# Patient Record
Sex: Female | Born: 1995 | Race: Black or African American | Hispanic: No | Marital: Single | Smoking: Never smoker
Health system: Southern US, Community
[De-identification: ages and names within clinical notes are randomized; demographics above are authoritative.]

## PROBLEM LIST (undated history)

## (undated) HISTORY — PX: LIVER BIOPSY: SHX301

## (undated) HISTORY — PX: BONE MARROW ASPIRATION: SHX1252

---
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
## 2020-10-25 ENCOUNTER — Other Ambulatory Visit: Payer: Self-pay

## 2020-10-25 ENCOUNTER — Emergency Department
Admission: EM | Admit: 2020-10-25 | Discharge: 2020-10-25 | Disposition: A | Discharge status: Home / Self Care | Payer: Commercial Managed Care - PPO | Attending: Emergency Medicine | Admitting: Emergency Medicine

## 2020-10-25 DIAGNOSIS — M79604 Pain in right leg: Secondary | ICD-10-CM | POA: Diagnosis present

## 2020-10-25 DIAGNOSIS — M5431 Sciatica, right side: Secondary | ICD-10-CM | POA: Diagnosis not present

## 2020-10-25 MED ORDER — IBUPROFEN 800 MG PO TABS: 800.0000 mg | ORAL_TABLET | Freq: Three times a day (TID) | ORAL | 0 refills | Status: DC | PRN

## 2020-10-25 MED ORDER — CYCLOBENZAPRINE HCL 5 MG PO TABS: 5.0000 mg | ORAL_TABLET | Freq: Three times a day (TID) | ORAL | 0 refills | Status: DC | PRN

## 2020-10-25 NOTE — ED Notes (Signed)
See triage note  Presents with pain to lower back  Radiates into right leg  Ambulates with slight limp

## 2020-10-25 NOTE — ED Provider Notes (Signed)
[facility] Emergency Department Provider Note ____________________________________________  Time seen: 0935  I have reviewed the triage vital signs and the nursing notes.  HISTORY  Chief Complaint  Leg Pain   HPI Amanda Brown is a 25 y.o. female presents to the for evaluation of 1 day complaint of posterior right-sided leg pain.  Patient describes muscle pain to the posterior right buttocks and thigh, that does not refer beyond the knee.  He denies any recent injury, trauma, or fall.  She also denies any bladder or bowel incontinence, foot drop, or saddle anesthesias.  She reports persistent pain, with sharp flares that makes her feel that her leg is going to give out.  She gives a remote history of similar symptoms, but denies any previous intervention or evaluation.  She has taken sporadic doses of ibuprofen 200 mg, but denies any significant benefit.  History reviewed. No pertinent past medical history.  There are no problems to display for this patient.   History reviewed. No pertinent surgical history.  Prior to Admission medications   Medication Sig Start Date End Date Taking? Authorizing Provider  cyclobenzaprine (FLEXERIL) 5 MG tablet Take 1 tablet (5 mg total) by mouth 3 (three) times daily as needed. 10/25/20  Yes Oluchi Pucci, Charlesetta Ivory, PA-C  ibuprofen (ADVIL) 800 MG tablet Take 1 tablet (800 mg total) by mouth every 8 (eight) hours as needed. 10/25/20  Yes Maisie Hauser, Charlesetta Ivory, PA-C    Allergies Patient has no known allergies.  History reviewed. No pertinent family history.  Social History Social History   Tobacco Use   Smoking status: Never   Smokeless tobacco: Never  Substance Use Topics   Alcohol use: Not Currently   Drug use: Not Currently    Review of Systems  Constitutional: Negative for fever. Cardiovascular: Negative for chest pain. Respiratory: Negative for shortness of breath. Gastrointestinal: Negative for abdominal  pain, vomiting and diarrhea. Genitourinary: Negative for dysuria. Musculoskeletal: Negative for back pain.  Reports right leg pain as above. Skin: Negative for rash. Neurological: Negative for headaches, focal weakness or numbness. ____________________________________________  PHYSICAL EXAM:  VITAL SIGNS: ED Triage Vitals  Enc Vitals Group     BP 10/25/20 0845 (!) 122/92     Pulse Rate 10/25/20 0845 66     Resp 10/25/20 0845 16     Temp 10/25/20 0845 98.2 F (36.8 C)     Temp Source 10/25/20 0845 Oral     SpO2 10/25/20 0845 100 %     Weight 10/25/20 0843 127 lb (57.6 kg)     Height 10/25/20 0843 5\' 3"  (1.6 m)     Head Circumference --      Peak Flow --      Pain Score 10/25/20 0843 8     Pain Loc --      Pain Edu? --      Excl. in GC? --     Constitutional: Alert and oriented. Well appearing and in no distress. Head: Normocephalic and atraumatic. Eyes: Conjunctivae are normal. Normal extraocular movements Cardiovascular: Normal rate, regular rhythm. Normal distal pulses. Respiratory: Normal respiratory effort. No wheezes/rales/rhonchi. Musculoskeletal: Normal spinal alignment without midline tenderness, spasm, deformity, or step-off.  Patient with normal transition from sit to stand.  Patient is able demonstrate normal lumbar flexion and extension range.  Normal hip flexion extension in standing position.  Nontender with normal range of motion in all extremities.  Neurologic: Cranial nerves II to XII grossly intact.  Normal LE DTRs bilaterally.  Normal toe dorsiflexion noted bilaterally.  Normal gait without ataxia. Normal speech and language. No gross focal neurologic deficits are appreciated. Skin:  Skin is warm, dry and intact. No rash noted. Psychiatric: Mood and affect are normal. Patient exhibits appropriate insight and judgment. ____________________________________________    {LABS (pertinent  positives/negatives)  ____________________________________________  {EKG  ____________________________________________   RADIOLOGY Official radiology report(s): No results found. ____________________________________________  PROCEDURES   Procedures ____________________________________________   INITIAL IMPRESSION / ASSESSMENT AND PLAN / ED COURSE  As part of my medical decision making, I reviewed the following data within the electronic MEDICAL RECORD NUMBER Notes from prior ED visits    DDX: sciatica, lumbar strain, myalgia    Patient ED evaluation of right posterior leg pain upon awakening.  Patient without any preceding injury or trauma, presents with intermittent muscle pain down the posterior chain.  Clinically the stable without any acute neuromuscular deficit.  Symptoms may represent a mild sciatica flare.  She was treated with anti-inflammatories and muscle relaxants.  Patient is referred to local primary provider for routine care.  Return precautions have been reviewed.  Amanda Brown was evaluated in Emergency Department on 10/25/2020 for the symptoms described in the history of present illness. She was evaluated in the context of the global COVID-19 pandemic, which necessitated consideration that the patient might be at risk for infection with the SARS-CoV-2 virus that causes COVID-19. Institutional protocols and algorithms that pertain to the evaluation of patients at risk for COVID-19 are in a state of rapid change based on information released by regulatory bodies including the CDC and federal and state organizations. These policies and algorithms were followed during the patient's care in the ED. ____________________________________________  FINAL CLINICAL IMPRESSION(S) / ED DIAGNOSES  Final diagnoses:  Right leg pain  Sciatica of right side      Karmen Stabs, Charlesetta Ivory, PA-C 10/25/20 1107    Arnaldo Natal, MD 10/25/20 1600

## 2020-10-25 NOTE — ED Triage Notes (Signed)
Pt c/o right upper thigh/buttock pain for the past 3-4 days, denies injury, pt is ambulatory to triage with a steady gait.

## 2021-01-07 ENCOUNTER — Encounter: Payer: Self-pay | Admitting: Nurse Practitioner

## 2021-01-18 ENCOUNTER — Other Ambulatory Visit: Payer: Self-pay

## 2021-01-18 ENCOUNTER — Encounter (INDEPENDENT_AMBULATORY_CARE_PROVIDER_SITE_OTHER): Payer: Self-pay

## 2021-01-18 ENCOUNTER — Encounter: Payer: Self-pay | Admitting: Nurse Practitioner

## 2021-01-18 ENCOUNTER — Ambulatory Visit: Payer: Commercial Managed Care - PPO | Admitting: Nurse Practitioner

## 2021-01-18 VITALS — BP 105/71 | HR 74 | Temp 98.1°F | Ht 64.5 in | Wt 141.6 lb

## 2021-01-18 DIAGNOSIS — G259 Extrapyramidal and movement disorder, unspecified: Secondary | ICD-10-CM

## 2021-01-18 DIAGNOSIS — Z1159 Encounter for screening for other viral diseases: Secondary | ICD-10-CM

## 2021-01-18 DIAGNOSIS — Z8489 Family history of other specified conditions: Secondary | ICD-10-CM | POA: Diagnosis not present

## 2021-01-18 DIAGNOSIS — Z862 Personal history of diseases of the blood and blood-forming organs and certain disorders involving the immune mechanism: Secondary | ICD-10-CM

## 2021-01-18 DIAGNOSIS — E538 Deficiency of other specified B group vitamins: Secondary | ICD-10-CM

## 2021-01-18 DIAGNOSIS — Z7689 Persons encountering health services in other specified circumstances: Secondary | ICD-10-CM

## 2021-01-18 DIAGNOSIS — R002 Palpitations: Secondary | ICD-10-CM

## 2021-01-18 DIAGNOSIS — G5622 Lesion of ulnar nerve, left upper limb: Secondary | ICD-10-CM | POA: Diagnosis not present

## 2021-01-18 DIAGNOSIS — Z114 Encounter for screening for human immunodeficiency virus [HIV]: Secondary | ICD-10-CM

## 2021-01-18 DIAGNOSIS — E559 Vitamin D deficiency, unspecified: Secondary | ICD-10-CM

## 2021-01-18 MED ORDER — PREDNISONE 10 MG PO TABS: ORAL_TABLET | ORAL | 0 refills | Status: DC

## 2021-01-18 NOTE — Assessment & Plan Note (Signed)
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  At this time will trial Prednisone taper and Ibuprofen as needed at home.  Educated her on diagnosis.  Recommend she wear elbow support while working and rest arm/hand intermittently during day with stretches.  Educated on PT exercises at home, which she will look up.  Return in 4 weeks, if ongoing will consider ortho referral and PT.

## 2021-01-18 NOTE — Assessment & Plan Note (Signed)
Father was a [place] Veteran and passed from Multiple Myeloma, monitor patient closely due to history of parent exposure.

## 2021-01-18 NOTE — Assessment & Plan Note (Addendum)
Present since childhood on and off.  Normal neuro exam today.  Labs obtained, CMP, TSH, and Mag level.  Consider neurology referral if ongoing or worsening.

## 2021-01-18 NOTE — Assessment & Plan Note (Signed)
Since childhood on occasion, no worsening.  Suspect PVC or PACs present.  EKG in office today no abnormalities and reassuring.  She does not wish to go to cardiology at this time, was offered due to palpitations and family history of cardiac disease, but she wishes to hold off at this time and pursue this in future if needed.  Labs today CBC, CMP, TSH, iron/ferritin, lipid.

## 2021-01-18 NOTE — Patient Instructions (Signed)
Cubital Tunnel Syndrome Cubital tunnel syndrome is a condition that causes pain and weakness of the forearm and hand. It happens when one of the nerves that runs along the inside of the elbow joint (ulnar nerve) becomes irritated. This condition is usually caused by repeated arm motions that are done during sports or work-related activities. What are the causes? This condition may be caused by: Increased pressure on the ulnar nerve at the elbow, arm, or forearm. This can result from: Irritation caused by repeated elbow bending. Poorly healed elbow fractures. Tumors in the elbow. These are usually noncancerous (benign). Scar tissue that develops in the elbow after an injury. Bony growths (spurs) near the ulnar nerve. Stretching of the nerve due to loose elbow ligaments. Trauma to the nerve at the elbow. What increases the risk? The following factors may make you more likely to develop this condition: Doing manual labor that requires frequent bending of the elbow. Playing sports that include repeated or strenuous throwing motions, such as baseball. Playing contact sports, such as football or lacrosse. Not warming up properly before activities. Having diabetes. Having an underactive thyroid (hypothyroidism). What are the signs or symptoms? Symptoms of this condition include: Clumsiness or weakness of the hand. Tenderness of the inner elbow. Aching or soreness of the inner elbow, forearm, or fingers, especially the little finger or the ring finger. Increased pain when forcing the elbow to bend. Reduced control when throwing objects. Tingling, numbness, or a burning feeling inside the forearm or in part of the hand or fingers, especially the little finger or the ring finger. Sharp pains that shoot from the elbow down to the wrist and hand. The inability to grip or pinch hard. How is this diagnosed? This condition is diagnosed based on: Your symptoms and medical history. Your health care  provider will also ask for details about any injury. A physical exam. You may also have tests, including: Electromyogram (EMG). This test measures electrical signals sent by your nerves into the muscles. Nerve conduction study. This test measures how well electrical signals pass through your nerves. Imaging tests, such as X-rays, ultrasound, and MRI. These tests check for possible causes of your condition. How is this treated? This condition may be treated by: Stopping the activities that are causing your symptoms to get worse. Icing and taking medicines to reduce pain and swelling. Wearing a splint to prevent your elbow from bending, or wearing an elbow pad where the ulnar nerve is closest to the skin. Working with a physical therapist in less severe cases. This may help to: Decrease your symptoms. Improve the strength and range of motion of your elbow, forearm, and hand. If these treatments do not help, surgery may be needed. Follow these instructions at home: If you have a splint: Wear the splint as told by your health care provider. Remove it only as told by your health care provider. Loosen the splint if your fingers tingle, become numb, or turn cold and blue. Keep the splint clean. If the splint is not waterproof: Do not let it get wet. Cover it with a watertight covering when you take a bath or shower. Managing pain, stiffness, and swelling  If directed, put ice on the injured area: Put ice in a plastic bag. Place a towel between your skin and the bag. Leave the ice on for 20 minutes, 2-3 times a day. Move your fingers often to avoid stiffness and to lessen swelling. Raise (elevate) the injured area above the level of your heart   while you are sitting or lying down. General instructions Take over-the-counter and prescription medicines only as told by your health care provider. Do any exercise or physical therapy as told by your health care provider. Do not drive or use heavy  machinery while taking prescription pain medicine. If you were given an elbow pad, wear it as told by your health care provider. Keep all follow-up visits as told by your health care provider. This is important. Contact a health care provider if: Your symptoms get worse. Your symptoms do not get better with treatment. You have new pain. Your hand on the injured side feels numb or cold. Summary Cubital tunnel syndrome is a condition that causes pain and weakness of the forearm and hand. You are more likely to develop this condition if you do work or play sports that involve repeated arm movements. This condition is often treated by stopping repetitive activities, applying ice, and using anti-inflammatory medicines. In rare cases, surgery may be needed. This information is not intended to replace advice given to you by your health care provider. Make sure you discuss any questions you have with your health care provider. Document Revised: 04/13/2020 Document Reviewed: 04/13/2020 Elsevier Patient Education  2022 Elsevier Inc.  

## 2021-01-18 NOTE — Progress Notes (Addendum)
New Patient Office Visit  Subjective:  Patient ID: Amanda Brown, female    DOB: August 24, 1995  Age: 25 y.o. MRN: [id number]  CC:  Chief Complaint  Patient presents with   Establish Care    Patient is here to establish care. Patient states she would like to have blood work done. Patient states she has numbness in her left arm and radiates down to her ring and pinky finger. Patient states she notices when she is sitting at her desk and typing and she will have the numbness and it will last the rest of the day.     HPI Amanda Brown presents for new patient visit to establish care.  Introduced to Designer, jewellery role and practice setting.  All questions answered.  Discussed provider/patient relationship and expectations.  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  Has a history of mental breakdown when her parents passed away -- dad passed in 2009/05/05 and mother passed in 2012/05/05.  No longer takes medication and saw psychiatry who reported no schizoaffective disorder present.  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  PALPITATIONS Since age 67 she has noticed occasional palpations.  Does endorse leg twitches at night on occasion, has been present since age 6.   Duration: months Symptom description: extra beats Duration of episode: seconds Frequency: recurrentl Activity when event occurred:  varies, often with resting Related to exertion: no Dyspnea: no Chest pain: no Syncope: no Anxiety/stress: no Nausea/vomiting: no Diaphoresis: no Coronary artery disease: no Congestive heart failure: no Arrhythmia:no Thyroid disease: no Caffeine intake: 1 cafienated beverage Status:  stable Treatments attempted:none   ARM NUMBNESS Has been present since last year, July -- comes and goes.  Some days are better then others.  Affects ring finger and pinky finger.  Right hand dominant.  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   Duration: chronic Location: left Mechanism of injury: unknown Onset:  gradual Severity: moderate  Quality:   numbness Frequency: intermittent Radiation: no Aggravating factors: movement and prolonged sitting -- sitting and typing a lot Alleviating factors: nothing has tried nothing at home Status: worse Treatments attempted: none  Relief with NSAIDs?:  No NSAIDs Taken Swelling: no Redness: no  Warmth: no Trauma: no Chest pain: no  Shortness of breath: no  Fever: no Decreased sensation: yes Paresthesias: yes Weakness: yes   History reviewed. No pertinent past medical history.  Past Surgical History:  Procedure Laterality Date   BONE MARROW ASPIRATION     LIVER BIOPSY      Family History  Problem Relation Age of Onset   Stroke Mother    Transient ischemic attack Mother    Cataracts Mother    Glaucoma Mother    Multiple myeloma Father    Heart disease Maternal Grandmother    Diabetes Maternal Grandmother    Dementia Maternal Grandfather     Social History   Socioeconomic History   Marital status: Single    Spouse name: Not on file   Number of children: Not on file   Years of education: Not on file   Highest education level: Not on file  Occupational History   Not on file  Tobacco Use   Smoking status: Never   Smokeless tobacco: Never  Substance and Sexual Activity   Alcohol use: Not Currently   Drug use: Not Currently   Sexual activity: Not Currently  Other Topics Concern   Not on file  Social History Narrative   Not on file   Social  Determinants of Health   Financial Resource Strain: Low Risk    Difficulty of Paying Living Expenses: Not very hard  Food Insecurity: No Food Insecurity   Worried About Running Out of Food in the Last Year: Never true   Ran Out of Food in the Last Year: Never true  Transportation Needs: Unmet Transportation Needs   Lack of Transportation (Medical): Yes   Lack of Transportation (Non-Medical): Yes  Physical Activity: Sufficiently Active   Days of Exercise per Week: 4 days   Minutes of  Exercise per Session: 40 min  Stress: No Stress Concern Present   Feeling of Stress : Only a little  Social Connections: Moderately Isolated   Frequency of Communication with Friends and Family: More than three times a week   Frequency of Social Gatherings with Friends and Family: More than three times a week   Attends Religious Services: More than 4 times per year   Active Member of [employer] or Organizations: No   Attends Music therapist: Never   Marital Status: Never married  Human resources officer Violence: Not At Risk   Fear of Current or Ex-Partner: No   Emotionally Abused: No   Physically Abused: No   Sexually Abused: No    ROS Review of Systems  Constitutional:  Negative for activity change, appetite change, diaphoresis, fatigue and fever.  Respiratory:  Negative for cough, chest tightness, shortness of breath and wheezing.   Cardiovascular:  Positive for palpitations. Negative for chest pain and leg swelling.  Gastrointestinal: Negative.   Musculoskeletal:  Positive for arthralgias.  Neurological:  Positive for weakness and numbness. Negative for dizziness, tremors, syncope, light-headedness and headaches.  Psychiatric/Behavioral: Negative.     Objective:   Today's Vitals: BP 105/71   Pulse 74   Temp 98.1 F (36.7 C) (Oral)   Ht 5' 4.5" (1.638 m)   Wt 141 lb 9.6 oz (64.2 kg)   SpO2 100%   BMI 23.93 kg/m   Physical Exam Vitals and nursing note reviewed.  Constitutional:      General: She is awake. She is not in acute distress.    Appearance: She is well-developed and well-groomed. She is not ill-appearing or toxic-appearing.  HENT:     Head: Normocephalic.     Right Ear: Hearing, tympanic membrane, ear canal and external ear normal.     Left Ear: Hearing, tympanic membrane, ear canal and external ear normal.     Nose: Nose normal.     Right Sinus: No maxillary sinus tenderness or frontal sinus tenderness.     Left Sinus: No maxillary sinus tenderness or  frontal sinus tenderness.     Mouth/Throat:     Mouth: Mucous membranes are moist.  Eyes:     General: Lids are normal.        Right eye: No discharge.        Left eye: No discharge.     Conjunctiva/sclera: Conjunctivae normal.     Pupils: Pupils are equal, round, and reactive to light.  Neck:     Thyroid: No thyromegaly.     Vascular: No carotid bruit.  Cardiovascular:     Rate and Rhythm: Normal rate and regular rhythm.     Heart sounds: Normal heart sounds. No murmur heard.   No gallop.  Pulmonary:     Effort: Pulmonary effort is normal. No accessory muscle usage or respiratory distress.     Breath sounds: Normal breath sounds.  Abdominal:     General: Bowel sounds  are normal.     Palpations: Abdomen is soft. There is no hepatomegaly or splenomegaly.  Musculoskeletal:     Right hand: No swelling, lacerations or tenderness. Normal range of motion. Normal strength. Normal sensation. Normal pulse.     Left hand: No swelling, lacerations or tenderness. Normal range of motion. Decreased strength. Normal sensation. Normal pulse.     Cervical back: Normal range of motion and neck supple.     Right lower leg: No edema.     Left lower leg: No edema.     Comments: Positive Phalen and Tinel to left side -- noted to 4th and 5th finger numbness with movement.  Lymphadenopathy:     Cervical: No cervical adenopathy.  Skin:    General: Skin is warm and dry.  Neurological:     Mental Status: She is alert and oriented to person, place, and time.     Cranial Nerves: Cranial nerves 2-12 are intact.     Coordination: Coordination is intact.     Gait: Gait is intact.     Deep Tendon Reflexes: Reflexes are normal and symmetric.     Reflex Scores:      Brachioradialis reflexes are 2+ on the right side and 2+ on the left side.      Patellar reflexes are 2+ on the right side and 2+ on the left side. Psychiatric:        Attention and Perception: Attention normal.        Mood and Affect: Mood  normal.        Speech: Speech normal.        Behavior: Behavior normal. Behavior is cooperative.        Thought Content: Thought content normal.   EKG My review and personal interpretation at Time: 1140   Indication: palpitations  Rate: 76  Rhythm: sinus Axis: normal Other: no nonspecific st abn, no stemi, no lvh  Assessment & Plan:   Problem List Items Addressed This Visit       Nervous and Auditory   Cubital tunnel syndrome on left - Primary    Acute and ongoing since last year with intermittent flares - works on Teaching laboratory technician daily.  At this time will trial Prednisone taper and Ibuprofen as needed at home.  Educated her on diagnosis.  Recommend she wear elbow support while working and rest arm/hand intermittently during day with stretches.  Educated on PT exercises at home, which she will look up.  Return in 4 weeks, if ongoing will consider ortho referral and PT.        Other   Abnormal leg movement    Present since childhood on and off.  Normal neuro exam today.  Labs obtained, CMP, TSH, and Mag level.  Consider neurology referral if ongoing or worsening.      Family history of exposure to Agent Orange    Father was a [place] Veteran and passed from Multiple Myeloma, monitor patient closely due to history of parent exposure.      Palpitations with regular cardiac rhythm    Since childhood on occasion, no worsening.  Suspect PVC or PACs present.  EKG in office today no abnormalities and reassuring.  She does not wish to go to cardiology at this time, was offered due to palpitations and family history of cardiac disease, but she wishes to hold off at this time and pursue this in future if needed.  Labs today CBC, CMP, TSH, iron/ferritin, lipid.  Relevant Orders   Comprehensive metabolic panel   Lipid Panel w/o Chol/HDL Ratio   TSH   Magnesium   EKG 12-Lead (Completed)   Other Visit Diagnoses     History of iron deficiency anemia       Check CBC and iron/ferritin today    Relevant Orders   Iron, TIBC and Ferritin Panel   CBC with Differential/Platelet   B12 deficiency       History of low levels reported, check today and start supplement as needed.   Relevant Orders   Vitamin B12   Vitamin D deficiency       History of low levels reported, check today and start supplement as needed.   Relevant Orders   VITAMIN D 25 Hydroxy (Vit-D Deficiency, Fractures)   Need for hepatitis C screening test       Hep C screening today per guidelines, discussed with patient.   Relevant Orders   Hepatitis C antibody   Encounter for screening for HIV       HIV screening today per guidelines, discussed with patient.   Relevant Orders   HIV Antibody (routine testing w rflx)   Encounter to establish care           Outpatient Encounter Medications as of 01/18/2021  Medication Sig   cyclobenzaprine (FLEXERIL) 5 MG tablet Take 1 tablet (5 mg total) by mouth 3 (three) times daily as needed.   ibuprofen (ADVIL) 800 MG tablet Take 1 tablet (800 mg total) by mouth every 8 (eight) hours as needed.   predniSONE (DELTASONE) 10 MG tablet Take 6 tablets by mouth daily for 2 days, then reduce by 1 tablet every 2 days until gone   No facility-administered encounter medications on file as of 01/18/2021.    Follow-up: Return in about 4 weeks (around 02/15/2021) for Cubital Tunnel Syndrome.   Venita Lick, NP

## 2021-01-19 LAB — CBC WITH DIFFERENTIAL/PLATELET
Basophils Absolute: 0 10*3/uL (ref 0.0–0.2)
Basos: 1 %
EOS (ABSOLUTE): 0.1 10*3/uL (ref 0.0–0.4)
Eos: 1 %
Hematocrit: 36.7 % (ref 34.0–46.6)
Hemoglobin: 12.7 g/dL (ref 11.1–15.9)
Immature Grans (Abs): 0 10*3/uL (ref 0.0–0.1)
Immature Granulocytes: 1 %
Lymphocytes Absolute: 2.1 10*3/uL (ref 0.7–3.1)
Lymphs: 27 %
MCH: 28.8 pg (ref 26.6–33.0)
MCHC: 34.6 g/dL (ref 31.5–35.7)
MCV: 83 fL (ref 79–97)
Monocytes Absolute: 0.6 10*3/uL (ref 0.1–0.9)
Monocytes: 7 %
Neutrophils Absolute: 5 10*3/uL (ref 1.4–7.0)
Neutrophils: 63 %
Platelets: 356 10*3/uL (ref 150–450)
RBC: 4.41 x10E6/uL (ref 3.77–5.28)
RDW: 13.2 % (ref 11.7–15.4)
WBC: 7.8 10*3/uL (ref 3.4–10.8)

## 2021-01-19 LAB — COMPREHENSIVE METABOLIC PANEL
ALT: 14 IU/L (ref 0–32)
AST: 13 IU/L (ref 0–40)
Albumin/Globulin Ratio: 2.4 — ABNORMAL HIGH (ref 1.2–2.2)
Albumin: 4.8 g/dL (ref 3.9–5.0)
Alkaline Phosphatase: 50 IU/L (ref 44–121)
BUN/Creatinine Ratio: 12 (ref 9–23)
BUN: 10 mg/dL (ref 6–20)
Bilirubin Total: 0.5 mg/dL (ref 0.0–1.2)
CO2: 22 mmol/L (ref 20–29)
Calcium: 9.6 mg/dL (ref 8.7–10.2)
Chloride: 102 mmol/L (ref 96–106)
Creatinine, Ser: 0.84 mg/dL (ref 0.57–1.00)
Globulin, Total: 2 g/dL (ref 1.5–4.5)
Glucose: 70 mg/dL (ref 70–99)
Potassium: 4.6 mmol/L (ref 3.5–5.2)
Sodium: 140 mmol/L (ref 134–144)
Total Protein: 6.8 g/dL (ref 6.0–8.5)
eGFR: 99 mL/min/{1.73_m2} (ref >59)

## 2021-01-19 LAB — IRON,TIBC AND FERRITIN PANEL
Ferritin: 45 ng/mL (ref 15–150)
Iron Saturation: 22 % (ref 15–55)
Iron: 96 ug/dL (ref 27–159)
Total Iron Binding Capacity: 434 ug/dL (ref 250–450)
UIBC: 338 ug/dL (ref 131–425)

## 2021-01-19 LAB — HEPATITIS C ANTIBODY: Hep C Virus Ab: 0.3 s/co ratio (ref 0.0–0.9)

## 2021-01-19 LAB — MAGNESIUM: Magnesium: 2 mg/dL (ref 1.6–2.3)

## 2021-01-19 LAB — LIPID PANEL W/O CHOL/HDL RATIO
Cholesterol, Total: 107 mg/dL (ref 100–199)
HDL: 42 mg/dL (ref >39)
LDL Chol Calc (NIH): 55 mg/dL (ref 0–99)
Triglycerides: 37 mg/dL (ref 0–149)
VLDL Cholesterol Cal: 10 mg/dL (ref 5–40)

## 2021-01-19 LAB — VITAMIN B12: Vitamin B-12: 501 pg/mL (ref 232–1245)

## 2021-01-19 LAB — HIV ANTIBODY (ROUTINE TESTING W REFLEX): HIV Screen 4th Generation wRfx: NONREACTIVE

## 2021-01-19 LAB — TSH: TSH: 1.92 u[IU]/mL (ref 0.450–4.500)

## 2021-01-19 LAB — VITAMIN D 25 HYDROXY (VIT D DEFICIENCY, FRACTURES): Vit D, 25-Hydroxy: 25.9 ng/mL — ABNORMAL LOW (ref 30.0–100.0)

## 2021-01-19 NOTE — Progress Notes (Signed)
Contacted via MyChart   Good morning Amanda Brown, all of your labs are nice and normal with exception of mildly low Vitamin D.  I recommend starting Vitamin D3 2000 units daily which is good for bone and muscle health.  Any questions?   Keep being stellar!!  Thank you for allowing me to participate in your care.  I appreciate you. Kindest regards, Terre Zabriskie

## 2021-02-07 ENCOUNTER — Encounter: Payer: Self-pay | Admitting: Nurse Practitioner

## 2021-02-07 ENCOUNTER — Ambulatory Visit: Payer: Self-pay | Admitting: *Deleted

## 2021-02-07 ENCOUNTER — Other Ambulatory Visit: Payer: Self-pay

## 2021-02-07 ENCOUNTER — Ambulatory Visit: Payer: Commercial Managed Care - PPO | Admitting: Nurse Practitioner

## 2021-02-07 VITALS — BP 124/87 | HR 86 | Temp 98.5°F | Ht 64.49 in | Wt 148.6 lb

## 2021-02-07 DIAGNOSIS — H6690 Otitis media, unspecified, unspecified ear: Secondary | ICD-10-CM | POA: Diagnosis not present

## 2021-02-07 DIAGNOSIS — R051 Acute cough: Secondary | ICD-10-CM | POA: Diagnosis not present

## 2021-02-07 DIAGNOSIS — J029 Acute pharyngitis, unspecified: Secondary | ICD-10-CM

## 2021-02-07 DIAGNOSIS — R197 Diarrhea, unspecified: Secondary | ICD-10-CM | POA: Diagnosis not present

## 2021-02-07 MED ORDER — BENZONATATE 200 MG PO CAPS: 200.0000 mg | ORAL_CAPSULE | Freq: Three times a day (TID) | ORAL | 0 refills | Status: DC | PRN

## 2021-02-07 MED ORDER — AMOXICILLIN-POT CLAVULANATE 875-125 MG PO TABS: 1.0000 | ORAL_TABLET | Freq: Two times a day (BID) | ORAL | 0 refills | Status: DC

## 2021-02-07 NOTE — Progress Notes (Signed)
Acute Office Visit  Subjective:    Patient ID: Amanda Brown, female    DOB: [birth year], 25 y.o.   MRN: [id number]  Chief Complaint  Patient presents with   Cough    All started last week.    Hoarse   Sore Throat   Diarrhea    HPI Patient is in today for sore throat and hoarseness after she got mad and yelled last week. Sore throat and hoarseness still ongoing, and developed a cough last night. Home covid-19 test negative yesterday.   UPPER RESPIRATORY TRACT INFECTION  Worst symptom: cough Fever: no Cough: yes Shortness of breath: no Wheezing: no Chest pain: no Chest tightness: no Chest congestion: yes Nasal congestion: no Runny nose: no Post nasal drip: yes Sneezing: no Sore throat: yes Swollen glands: no Sinus pressure: no Headache: yes Face pain: no Toothache: no Ear pain: no  Ear pressure: no  Eyes red/itching:no Eye drainage/crusting: no  Vomiting: yes - from coughing Rash: no Fatigue: yes Sick contacts: no Strep contacts: no  Context: worse Recurrent sinusitis: no Relief with OTC cold/cough medications: no  Treatments attempted: chloraseptic spray, night cold/flu, cough drops    History reviewed. No pertinent past medical history.  Past Surgical History:  Procedure Laterality Date   BONE MARROW ASPIRATION     LIVER BIOPSY      Family History  Problem Relation Age of Onset   Stroke Mother    Transient ischemic attack Mother    Cataracts Mother    Glaucoma Mother    Multiple myeloma Father    Heart disease Maternal Grandmother    Diabetes Maternal Grandmother    Dementia Maternal Grandfather     Social History   Socioeconomic History   Marital status: Single    Spouse name: Not on file   Number of children: Not on file   Years of education: Not on file   Highest education level: Not on file  Occupational History   Not on file  Tobacco Use   Smoking status: Never   Smokeless tobacco: Never  Substance and Sexual Activity    Alcohol use: Not Currently   Drug use: Not Currently   Sexual activity: Not Currently  Other Topics Concern   Not on file  Social History Narrative   Not on file   Social Determinants of Health   Financial Resource Strain: Low Risk    Difficulty of Paying Living Expenses: Not very hard  Food Insecurity: No Food Insecurity   Worried About Running Out of Food in the Last Year: Never true   Ran Out of Food in the Last Year: Never true  Transportation Needs: Unmet Transportation Needs   Lack of Transportation (Medical): Yes   Lack of Transportation (Non-Medical): Yes  Physical Activity: Sufficiently Active   Days of Exercise per Week: 4 days   Minutes of Exercise per Session: 40 min  Stress: No Stress Concern Present   Feeling of Stress : Only a little  Social Connections: Moderately Isolated   Frequency of Communication with Friends and Family: More than three times a week   Frequency of Social Gatherings with Friends and Family: More than three times a week   Attends Religious Services: More than 4 times per year   Active Member of [employer] or Organizations: No   Attends Archivist Meetings: Never   Marital Status: Never married  Human resources officer Violence: Not At Risk   Fear of Current or Ex-Partner: No   Emotionally Abused: No  Physically Abused: No   Sexually Abused: No    Outpatient Medications Prior to Visit  Medication Sig Dispense Refill   cyclobenzaprine (FLEXERIL) 5 MG tablet Take 1 tablet (5 mg total) by mouth 3 (three) times daily as needed. 15 tablet 0   ibuprofen (ADVIL) 800 MG tablet Take 1 tablet (800 mg total) by mouth every 8 (eight) hours as needed. 30 tablet 0   predniSONE (DELTASONE) 10 MG tablet Take 6 tablets by mouth daily for 2 days, then reduce by 1 tablet every 2 days until gone 42 tablet 0   No facility-administered medications prior to visit.    Allergies  Allergen Reactions   Olanzapine Other (See Comments)    Hepatitis 2014      Review of Systems  Constitutional:  Positive for fatigue. Negative for fever.  HENT:  Positive for postnasal drip and sore throat. Negative for congestion, ear pain, rhinorrhea, sinus pressure and sneezing.   Eyes: Negative.   Respiratory:  Positive for cough. Negative for shortness of breath.   Cardiovascular: Negative.   Gastrointestinal:  Positive for nausea and vomiting. Negative for abdominal pain.  Endocrine: Negative.   Genitourinary: Negative.   Musculoskeletal:  Positive for myalgias.  Skin: Negative.   Neurological: Negative.   Psychiatric/Behavioral: Negative.        Objective:    Physical Exam Vitals and nursing note reviewed.  Constitutional:      General: She is not in acute distress.    Appearance: Normal appearance.  HENT:     Head: Normocephalic.     Right Ear: Ear canal and external ear normal. A middle ear effusion is present. Tympanic membrane is erythematous.     Left Ear: Tympanic membrane, ear canal and external ear normal.  Eyes:     Conjunctiva/sclera: Conjunctivae normal.  Cardiovascular:     Rate and Rhythm: Normal rate and regular rhythm.     Pulses: Normal pulses.     Heart sounds: Normal heart sounds.  Pulmonary:     Effort: Pulmonary effort is normal.     Breath sounds: Normal breath sounds.  Abdominal:     Palpations: Abdomen is soft.     Tenderness: There is no abdominal tenderness.  Musculoskeletal:     Cervical back: Normal range of motion and neck supple. No tenderness.  Lymphadenopathy:     Cervical: No cervical adenopathy.  Skin:    General: Skin is warm.  Neurological:     General: No focal deficit present.     Mental Status: She is alert and oriented to person, place, and time.  Psychiatric:        Mood and Affect: Mood normal.        Behavior: Behavior normal.        Thought Content: Thought content normal.        Judgment: Judgment normal.    BP 124/87   Pulse 86   Temp 98.5 F (36.9 C) (Oral)   Ht 5' 4.49"  (1.638 m)   Wt 148 lb 9.6 oz (67.4 kg)   SpO2 98%   BMI 25.12 kg/m  Wt Readings from Last 3 Encounters:  02/07/21 148 lb 9.6 oz (67.4 kg)  01/18/21 141 lb 9.6 oz (64.2 kg)  10/25/20 127 lb (57.6 kg)    Health Maintenance Due  Topic Date Due   Pneumococcal Vaccine 48-31 Years old (1 - PCV) Never done   PAP SMEAR-Modifier  Never done    There are no preventive care reminders to  display for this patient.   Lab Results  Component Value Date   TSH 1.920 01/18/2021   Lab Results  Component Value Date   WBC 7.8 01/18/2021   HGB 12.7 01/18/2021   HCT 36.7 01/18/2021   MCV 83 01/18/2021   PLT 356 01/18/2021   Lab Results  Component Value Date   NA 140 01/18/2021   K 4.6 01/18/2021   CO2 22 01/18/2021   GLUCOSE 70 01/18/2021   BUN 10 01/18/2021   CREATININE 0.84 01/18/2021   BILITOT 0.5 01/18/2021   ALKPHOS 50 01/18/2021   AST 13 01/18/2021   ALT 14 01/18/2021   PROT 6.8 01/18/2021   ALBUMIN 4.8 01/18/2021   CALCIUM 9.6 01/18/2021   EGFR 99 01/18/2021   Lab Results  Component Value Date   CHOL 107 01/18/2021   Lab Results  Component Value Date   HDL 42 01/18/2021   Lab Results  Component Value Date   LDLCALC 55 01/18/2021   Lab Results  Component Value Date   TRIG 37 01/18/2021   No results found for: CHOLHDL No results found for: HGBA1C     Assessment & Plan:   Problem List Items Addressed This Visit   None Visit Diagnoses     Acute cough    -  Primary   Flu negative. Will check for covid-19. Tessalon prn sent to pharmacy. Encouraged rest, fluids. F/U if not improving   Relevant Orders   Novel Coronavirus, NAA ([facility])   Veritor Flu A/B Waived   Rapid Strep screen([facility])   Sore throat       Negative for strep. could be from yelling last week/URI. Drink warm fluids, gargle with warm salt water.    Relevant Orders   Novel Coronavirus, NAA ([facility])   Veritor Flu A/B Waived   Rapid Strep screen([facility])   Diarrhea,  unspecified type       Flu and strep negative. Covid pending. Encouraged rest and fluids. Eat bland foods. F/U if not improving.   Relevant Orders   Novel Coronavirus, NAA ([facility])   Veritor Flu A/B Waived   Rapid Strep screen([facility])   Acute otitis media, unspecified otitis media type       Right ear. Will treat with augmentin and flonase. Follow up if symptoms not improving.    Relevant Medications   amoxicillin-clavulanate (AUGMENTIN) 875-125 MG tablet        Meds ordered this encounter  Medications   amoxicillin-clavulanate (AUGMENTIN) 875-125 MG tablet    Sig: Take 1 tablet by mouth 2 (two) times daily.    Dispense:  20 tablet    Refill:  0   benzonatate (TESSALON) 200 MG capsule    Sig: Take 1 capsule (200 mg total) by mouth 3 (three) times daily as needed for cough.    Dispense:  30 capsule    Refill:  0      Charyl Dancer, NP

## 2021-02-07 NOTE — Telephone Encounter (Signed)
I returned pt's call.   She called in earlier today c/o possible laryngitis, sore throat, hoarseness, coughing since Monday 01/30/2021.   Also maybe has food poisoning from an under cooked burger she had on Sunday.  Having stomach ache and diarrhea.  She is c/o the above listed symptoms.  She has been using OTC medications which help some.    See triage notes.  I went over the care advice and advised her to go to the ED or urgent care if she developed shortness of breath or chest tightness.  She verbalized understanding.  I made her an appt with Rodman Pickle, NP for today at 11:20 in office visit.

## 2021-02-07 NOTE — Patient Instructions (Addendum)
Flonase daily for post nasal drip Start augmentin twice a day for 10 days

## 2021-02-07 NOTE — Telephone Encounter (Signed)
Reason for Disposition  [1] Continuous (nonstop) coughing interferes with work or school AND [2] no improvement using cough treatment per Care Advice  Answer Assessment - Initial Assessment Questions 1. ONSET: "When did the cough begin?"      I returned her call.   Cough started last Tuesday. 2. SEVERITY: "How bad is the cough today?"      The cough is more frequent now 3. SPUTUM: "Describe the color of your sputum" (none, dry cough; clear, white, yellow, green)     Dry cough 4. HEMOPTYSIS: "Are you coughing up any blood?" If so ask: "How much?" (flecks, streaks, tablespoons, etc.)     No  5. DIFFICULTY BREATHING: "Are you having difficulty breathing?" If Yes, ask: "How bad is it?" (e.g., mild, moderate, severe)    - MILD: No SOB at rest, mild SOB with walking, speaks normally in sentences, can lie down, no retractions, pulse < 100.    - MODERATE: SOB at rest, SOB with minimal exertion and prefers to sit, cannot lie down flat, speaks in phrases, mild retractions, audible wheezing, pulse 100-120.    - SEVERE: Very SOB at rest, speaks in single words, struggling to breathe, sitting hunched forward, retractions, pulse > 120      No shortness of breath 6. FEVER: "Do you have a fever?" If Yes, ask: "What is your temperature, how was it measured, and when did it start?"     I felt warm last night.  My thermometer battery is dead. 7. CARDIAC HISTORY: "Do you have any history of heart disease?" (e.g., heart attack, congestive heart failure)      No 8. LUNG HISTORY: "Do you have any history of lung disease?"  (e.g., pulmonary embolus, asthma, emphysema)     No 9. PE RISK FACTORS: "Do you have a history of blood clots?" (or: recent major surgery, recent prolonged travel, bedridden)     No 10. OTHER SYMPTOMS: "Do you have any other symptoms?" (e.g., runny nose, wheezing, chest pain)       Sore throat, diarrhea.   No runny nose.  No sinus or ear pressure.   Last Monday I was stressed out.   I was  screaming and became hoarse.   I'm more hoarse today.   I coughed all night and could not sleep. I've had diarrhea this morning.   I ate out on Sunday and my burger was undercooked at the [employer].   The diarrhea started yesterday.   My stomach hurts 4-5/10.      11 . PREGNANCY: "Is there any chance you are pregnant?" "When was your last menstrual period?"       No 12. TRAVEL: "Have you traveled out of the country in the last month?" (e.g., travel history, exposures)       No sick exposures she's aware of.  Protocols used: Cough - Acute Non-Productive-A-AH

## 2021-02-08 LAB — NOVEL CORONAVIRUS, NAA: SARS-CoV-2, NAA: NOT DETECTED

## 2021-02-10 LAB — VERITOR FLU A/B WAIVED
Influenza A: NEGATIVE
Influenza B: NEGATIVE

## 2021-02-10 LAB — RAPID STREP SCREEN (MED CTR MEBANE ONLY): Strep Gp A Ag, IA W/Reflex: NEGATIVE

## 2021-02-10 LAB — CULTURE, GROUP A STREP: Strep A Culture: NEGATIVE

## 2021-02-15 ENCOUNTER — Ambulatory Visit: Payer: Commercial Managed Care - PPO | Admitting: Nurse Practitioner

## 2021-02-15 ENCOUNTER — Other Ambulatory Visit: Payer: Self-pay

## 2021-02-15 ENCOUNTER — Encounter: Payer: Self-pay | Admitting: Nurse Practitioner

## 2021-02-15 DIAGNOSIS — G5622 Lesion of ulnar nerve, left upper limb: Secondary | ICD-10-CM | POA: Diagnosis not present

## 2021-02-15 NOTE — Progress Notes (Signed)
BP 103/71    Pulse 74    Temp 98.5 F (36.9 C)    Wt 145 lb 3.2 oz (65.9 kg)    SpO2 99%    BMI 24.55 kg/m    Subjective:    Patient ID: Amanda Brown, female    DOB: [birth year], 25 y.o.   MRN: [id number]  HPI: Amanda Brown is a 25 y.o. female  Chief Complaint  Patient presents with   Cubital Tunnel Syndrome    Patient states it still feels the same as far as the numbness is still there. Patient states it seems like some days are worse than others when it flares up. Patient states the medicine helps a little bit.    CUBITAL TUNNEL SYNDROME Follow-up today, started -- Prednisone and Ibuprofen ordered.  Has been present since last year, July -- comes and goes.  Some days are better then others.  Affects ring finger and pinky finger.  Right hand dominant.  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    Numbness continues, improved but not completely gone with treatment. Duration: chronic Location: left Mechanism of injury: unknown Onset: gradual Severity: moderate  Quality:   numbness Frequency: intermittent Radiation: no Aggravating factors: movement and prolonged sitting -- sitting and typing a lot Alleviating factors: nothing has tried nothing at home Status: worse Treatments attempted: none  Relief with NSAIDs?:  No NSAIDs Taken Swelling: no Redness: no  Warmth: no Trauma: no Chest pain: no  Shortness of breath: no  Fever: no Decreased sensation: yes Paresthesias: yes Weakness: yes --- left hand   Relevant past medical, surgical, family and social history reviewed and updated as indicated. Interim medical history since our last visit reviewed. Allergies and medications reviewed and updated.  Review of Systems  Constitutional:  Negative for activity change, appetite change, diaphoresis, fatigue and fever.  Respiratory:  Negative for cough, chest tightness, shortness of breath and wheezing.   Cardiovascular:  Negative for chest pain, palpitations and leg swelling.   Gastrointestinal: Negative.   Musculoskeletal:  Positive for arthralgias.  Neurological:  Positive for weakness and numbness. Negative for dizziness, tremors, syncope, light-headedness and headaches.  Psychiatric/Behavioral: Negative.     Per HPI unless specifically indicated above     Objective:    BP 103/71    Pulse 74    Temp 98.5 F (36.9 C)    Wt 145 lb 3.2 oz (65.9 kg)    SpO2 99%    BMI 24.55 kg/m   Wt Readings from Last 3 Encounters:  02/15/21 145 lb 3.2 oz (65.9 kg)  02/07/21 148 lb 9.6 oz (67.4 kg)  01/18/21 141 lb 9.6 oz (64.2 kg)    Physical Exam Vitals and nursing note reviewed.  Constitutional:      General: She is awake. She is not in acute distress.    Appearance: She is well-developed and well-groomed. She is not ill-appearing or toxic-appearing.  HENT:     Head: Normocephalic.     Right Ear: Hearing, tympanic membrane, ear canal and external ear normal.     Left Ear: Hearing, tympanic membrane, ear canal and external ear normal.     Nose: Nose normal.     Right Sinus: No maxillary sinus tenderness or frontal sinus tenderness.     Left Sinus: No maxillary sinus tenderness or frontal sinus tenderness.     Mouth/Throat:     Mouth: Mucous membranes are moist.  Eyes:     General: Lids are normal.  Right eye: No discharge.        Left eye: No discharge.     Conjunctiva/sclera: Conjunctivae normal.     Pupils: Pupils are equal, round, and reactive to light.  Neck:     Thyroid: No thyromegaly.     Vascular: No carotid bruit.  Cardiovascular:     Rate and Rhythm: Normal rate and regular rhythm.     Heart sounds: Normal heart sounds. No murmur heard.   No gallop.  Pulmonary:     Effort: Pulmonary effort is normal. No accessory muscle usage or respiratory distress.     Breath sounds: Normal breath sounds.  Abdominal:     General: Bowel sounds are normal.     Palpations: Abdomen is soft. There is no hepatomegaly or splenomegaly.  Musculoskeletal:      Right hand: No swelling, lacerations or tenderness. Normal range of motion. Normal strength. Normal sensation. Normal pulse.     Left hand: No swelling, lacerations or tenderness. Normal range of motion. Decreased strength. Normal sensation. Normal pulse.     Cervical back: Normal range of motion and neck supple.     Right lower leg: No edema.     Left lower leg: No edema.     Comments: Positive Phalen and Tinel to left side -- noted to 4th and 5th finger numbness with movement.  Lymphadenopathy:     Cervical: No cervical adenopathy.  Skin:    General: Skin is warm and dry.  Neurological:     Mental Status: She is alert and oriented to person, place, and time.     Cranial Nerves: Cranial nerves 2-12 are intact.     Coordination: Coordination is intact.     Gait: Gait is intact.     Deep Tendon Reflexes: Reflexes are normal and symmetric.     Reflex Scores:      Brachioradialis reflexes are 2+ on the right side and 2+ on the left side.      Patellar reflexes are 2+ on the right side and 2+ on the left side. Psychiatric:        Attention and Perception: Attention normal.        Mood and Affect: Mood normal.        Speech: Speech normal.        Behavior: Behavior normal. Behavior is cooperative.        Thought Content: Thought content normal.    Results for orders placed or performed in visit on 02/07/21  Novel Coronavirus, NAA ([facility])   Specimen: Nasopharyngeal(NP) swabs in vial transport medium  Result Value Ref Range   SARS-CoV-2, NAA Not Detected Not Detected  Rapid Strep screen([facility])   Specimen: Other   Other  Result Value Ref Range   Strep Gp A Ag, IA W/Reflex Negative Negative  Culture, Group A Strep   Other  Result Value Ref Range   Strep A Culture Negative   Veritor Flu A/B Waived  Result Value Ref Range   Influenza A Negative Negative   Influenza B Negative Negative      Assessment & Plan:   Problem List Items Addressed This Visit       Nervous  and Auditory   Cubital tunnel syndrome on left    Acute and ongoing since last year with intermittent flares - works on computer daily -- some improvement with Prednisone, but not 100%.  Educated her on diagnosis and finding PT stretches online.  Recommend she wear elbow support while working and rest  arm/hand intermittently during day with stretches.  If ongoing will consider ortho referral and PT.        Follow up plan: Return in about 1 year (around 02/01/2022) for Annual physical.

## 2021-02-15 NOTE — Patient Instructions (Signed)
Cubital Tunnel Syndrome Cubital tunnel syndrome is a condition that causes pain and weakness of the forearm and hand. It happens when one of the nerves that runs along the inside of the elbow joint (ulnar nerve) becomes irritated. This condition is usually caused by repeated arm motions that are done during sports or work-related activities. What are the causes? This condition may be caused by: Increased pressure on the ulnar nerve at the elbow, arm, or forearm. This can result from: Irritation caused by repeated elbow bending. Poorly healed elbow fractures. Tumors in the elbow. These are usually noncancerous (benign). Scar tissue that develops in the elbow after an injury. Bony growths (spurs) near the ulnar nerve. Stretching of the nerve due to loose elbow ligaments. Trauma to the nerve at the elbow. What increases the risk? The following factors may make you more likely to develop this condition: Doing manual labor that requires frequent bending of the elbow. Playing sports that include repeated or strenuous throwing motions, such as baseball. Playing contact sports, such as football or lacrosse. Not warming up properly before activities. Having diabetes. Having an underactive thyroid (hypothyroidism). What are the signs or symptoms? Symptoms of this condition include: Clumsiness or weakness of the hand. Tenderness of the inner elbow. Aching or soreness of the inner elbow, forearm, or fingers, especially the little finger or the ring finger. Increased pain when forcing the elbow to bend. Reduced control when throwing objects. Tingling, numbness, or a burning feeling inside the forearm or in part of the hand or fingers, especially the little finger or the ring finger. Sharp pains that shoot from the elbow down to the wrist and hand. The inability to grip or pinch hard. How is this diagnosed? This condition is diagnosed based on: Your symptoms and medical history. Your health care  provider will also ask for details about any injury. A physical exam. You may also have tests, including: Electromyogram (EMG). This test measures electrical signals sent by your nerves into the muscles. Nerve conduction study. This test measures how well electrical signals pass through your nerves. Imaging tests, such as X-rays, ultrasound, and MRI. These tests check for possible causes of your condition. How is this treated? This condition may be treated by: Stopping the activities that are causing your symptoms to get worse. Icing and taking medicines to reduce pain and swelling. Wearing a splint to prevent your elbow from bending, or wearing an elbow pad where the ulnar nerve is closest to the skin. Working with a physical therapist in less severe cases. This may help to: Decrease your symptoms. Improve the strength and range of motion of your elbow, forearm, and hand. If these treatments do not help, surgery may be needed. Follow these instructions at home: If you have a splint: Wear the splint as told by your health care provider. Remove it only as told by your health care provider. Loosen the splint if your fingers tingle, become numb, or turn cold and blue. Keep the splint clean. If the splint is not waterproof: Do not let it get wet. Cover it with a watertight covering when you take a bath or shower. Managing pain, stiffness, and swelling  If directed, put ice on the injured area: Put ice in a plastic bag. Place a towel between your skin and the bag. Leave the ice on for 20 minutes, 2-3 times a day. Move your fingers often to avoid stiffness and to lessen swelling. Raise (elevate) the injured area above the level of your heart   while you are sitting or lying down. General instructions Take over-the-counter and prescription medicines only as told by your health care provider. Do any exercise or physical therapy as told by your health care provider. Do not drive or use heavy  machinery while taking prescription pain medicine. If you were given an elbow pad, wear it as told by your health care provider. Keep all follow-up visits as told by your health care provider. This is important. Contact a health care provider if: Your symptoms get worse. Your symptoms do not get better with treatment. You have new pain. Your hand on the injured side feels numb or cold. Summary Cubital tunnel syndrome is a condition that causes pain and weakness of the forearm and hand. You are more likely to develop this condition if you do work or play sports that involve repeated arm movements. This condition is often treated by stopping repetitive activities, applying ice, and using anti-inflammatory medicines. In rare cases, surgery may be needed. This information is not intended to replace advice given to you by your health care provider. Make sure you discuss any questions you have with your health care provider. Document Revised: 04/13/2020 Document Reviewed: 04/13/2020 Elsevier Patient Education  2022 Elsevier Inc.  

## 2021-02-15 NOTE — Assessment & Plan Note (Signed)
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  Educated her on diagnosis and finding PT stretches online.  Recommend she wear elbow support while working and rest arm/hand intermittently during day with stretches.  If ongoing will consider ortho referral and PT.

## 2021-02-21 ENCOUNTER — Ambulatory Visit: Payer: Self-pay | Admitting: *Deleted

## 2021-02-21 NOTE — Telephone Encounter (Signed)
Called and left patient a detailed VM letting her know what Lauren said. DPR verified for VM.

## 2021-02-21 NOTE — Telephone Encounter (Signed)
Reason for Disposition  [1] Continuous (nonstop) coughing interferes with work or school AND [2] no improvement using cough treatment per Care Advice  Answer Assessment - Initial Assessment Questions 1. ONSET: "When did the cough begin?"      Now I'm coughing up clear/white mucus worse at night.   Sometimes I vomit when I cough I returned her call.   She had called in earlier today c/o continued coughing and hoarseness.    She took antibiotic and finished 2 days ago.   My coughing has decreased.   It was dry but now it's wet with mucus.   I'm taking OTC Dayquil and Nyquil too.   It does help with coughing.   I'm having post nasal drip.   No runny nose is stuffy now.     2. SEVERITY: "How bad is the cough today?"      Not getting better. 3. SPUTUM: "Describe the color of your sputum" (none, dry cough; clear, white, yellow, green)     White/clear mucus 4. HEMOPTYSIS: "Are you coughing up any blood?" If so ask: "How much?" (flecks, streaks, tablespoons, etc.)     Not asked 5. DIFFICULTY BREATHING: "Are you having difficulty breathing?" If Yes, ask: "How bad is it?" (e.g., mild, moderate, severe)    - MILD: No SOB at rest, mild SOB with walking, speaks normally in sentences, can lie down, no retractions, pulse < 100.    - MODERATE: SOB at rest, SOB with minimal exertion and prefers to sit, cannot lie down flat, speaks in phrases, mild retractions, audible wheezing, pulse 100-120.    - SEVERE: Very SOB at rest, speaks in single words, struggling to breathe, sitting hunched forward, retractions, pulse > 120      No shortness of breath chest pain 6. FEVER: "Do you have a fever?" If Yes, ask: "What is your temperature, how was it measured, and when did it start?"     No 7. CARDIAC HISTORY: "Do you have any history of heart disease?" (e.g., heart attack, congestive heart failure)      Not asked 8. LUNG HISTORY: "Do you have any history of lung disease?"  (e.g., pulmonary embolus, asthma, emphysema)      Not asked 9. PE RISK FACTORS: "Do you have a history of blood clots?" (or: recent major surgery, recent prolonged travel, bedridden)     notasked 10. OTHER SYMPTOMS: "Do you have any other symptoms?" (e.g., runny nose, wheezing, chest pain)       Diarrhea has stopped today.   She was seen 02/07/2021 for URI.  11. PREGNANCY: "Is there any chance you are pregnant?" "When was your last menstrual period?"       Not asked 12. TRAVEL: "Have you traveled out of the country in the last month?" (e.g., travel history, exposures)       Not asked  Protocols used: Cough - Acute Productive-A-AH

## 2021-02-21 NOTE — Telephone Encounter (Signed)
She can take delsym over the counter to help with her cough. If she is not better, she would need an appointment before any prescriptions can be sent.

## 2021-02-21 NOTE — Telephone Encounter (Signed)
°  Chief Complaint: continued coughing and hoarseness  (Seen my Phyllis Ginger on 12/6 for URI) Symptoms: cough has gone from dry hacking to coughing clear white mucus, having post nasal drip, throat still scratchy.   Frequency: Cough worse at night Pertinent Negatives: Patient denies diarrhea now.   Had it in the beginning.   Denies shortness of breath or chest tightness. Disposition: [] ED /[] Urgent Care (no appt availability in office) / [] Appointment(In office/virtual)/ []  [place] [facility]/ [] Home Care/ [] Refused Recommended Disposition  Additional Notes: She is requesting I send a note to see if has any other suggestions for medications to help with the coughing since she is better but still coughing.    She is using Nyquil and Dayquil for the coughing which is helping.    I have sent a note to [facility] with her request.   She can be reached at [phone].

## 2021-02-28 ENCOUNTER — Encounter: Payer: Self-pay | Admitting: Nurse Practitioner

## 2021-02-28 ENCOUNTER — Ambulatory Visit: Payer: Commercial Managed Care - PPO | Admitting: Nurse Practitioner

## 2021-02-28 ENCOUNTER — Ambulatory Visit: Payer: Self-pay | Admitting: *Deleted

## 2021-02-28 ENCOUNTER — Ambulatory Visit
Admission: RE | Admit: 2021-02-28 | Discharge: 2021-02-28 | Disposition: A | Discharge status: Home / Self Care | Payer: Commercial Managed Care - PPO | Source: Ambulatory Visit | Attending: Nurse Practitioner | Admitting: Nurse Practitioner

## 2021-02-28 ENCOUNTER — Other Ambulatory Visit: Payer: Self-pay

## 2021-02-28 ENCOUNTER — Ambulatory Visit
Admission: RE | Admit: 2021-02-28 | Discharge: 2021-02-28 | Disposition: A | Discharge status: Home / Self Care | Payer: Commercial Managed Care - PPO | Source: Home / Self Care | Attending: Nurse Practitioner | Admitting: Nurse Practitioner

## 2021-02-28 VITALS — BP 112/84 | HR 75 | Temp 98.0°F | Ht 63.5 in | Wt 148.6 lb

## 2021-02-28 DIAGNOSIS — M79641 Pain in right hand: Secondary | ICD-10-CM | POA: Insufficient documentation

## 2021-02-28 MED ORDER — IBUPROFEN 800 MG PO TABS
800.0000 mg | ORAL_TABLET | Freq: Three times a day (TID) | ORAL | 4 refills | Status: DC | PRN
Start: 2021-02-28 — End: 2021-06-30

## 2021-02-28 NOTE — Telephone Encounter (Signed)
°  Chief Complaint: possible broken bone in right had Symptoms: pain, redness and swelling, niumbness and tingling up arm into armpit Frequency: Constantly Pertinent Negatives: Patient denies Can use hand but it's very painful Disposition: [] ED /[] Urgent Care (no appt availability in office) / [x] Appointment(In office/virtual)/ []  [facility]/ [] Home Care/ [] Refused Recommended Disposition  Additional Notes: Appt was made by the agent for today at 11:00 with , NP

## 2021-02-28 NOTE — Progress Notes (Signed)
BP 112/84    Pulse 75    Temp 98 F (36.7 C) (Oral)    Ht 5' 3.5" (1.613 m)    Wt 148 lb 9.6 oz (67.4 kg)    SpO2 99%    BMI 25.91 kg/m    Subjective:    Patient ID: Amanda Brown, female    DOB: May 17, 1995, 25 y.o.   MRN: [id number]  HPI: Amanda Brown is a 25 y.o. female  Chief Complaint  Patient presents with   Hand Injury    Patient is here for a possible broken bone in R hand. Patient states yesterday she was at work and got frustrated and slammed both of her hands on the furniture. Patient states after the shift she noticed tingling, pain and numbness in the hand that lead up her right arm. Patient states today she has numbness and pain and it radiates up her arm into her armpit. Patient states she took an old prescription that she had when she has leg pain to help with the pain and discomfort.    Wrist Pain   HAND PAIN She is concerned for possible broken bone in right hand, was at work and [employer] both of her hand down on furniture due to being frustrated at work.  Afterwards she noticed tingling, pain, and numbness to right arm.  Right hand was hurting a lot.  Was swollen initially, but this has improved some. Duration: days Involved hand: right Mechanism of injury: trauma Location: dorsal and palmar area extending up into elbow and armpit Onset: sudden Severity: 9/10  Quality: sharp, dull, aching, and throbbing Frequency: constant Radiation: yes Aggravating factors: movement, holding items Alleviating factors: stronger dose Ibuprofen she had leftover Treatments attempted: as above Relief with NSAIDs?: moderate Weakness: yes Numbness: yes Redness: no Swelling:yes Bruising: no Fevers: no   Relevant past medical, surgical, family and social history reviewed and updated as indicated. Interim medical history since our last visit reviewed. Allergies and medications reviewed and updated.  Review of Systems  Constitutional:  Negative for activity change, appetite change,  diaphoresis, fatigue and fever.  Respiratory:  Negative for cough, chest tightness, shortness of breath and wheezing.   Cardiovascular:  Negative for chest pain, palpitations and leg swelling.  Gastrointestinal: Negative.   Musculoskeletal:  Positive for arthralgias.  Neurological:  Positive for weakness and numbness. Negative for dizziness, tremors, syncope, light-headedness and headaches.  Psychiatric/Behavioral: Negative.     Per HPI unless specifically indicated above     Objective:    BP 112/84    Pulse 75    Temp 98 F (36.7 C) (Oral)    Ht 5' 3.5" (1.613 m)    Wt 148 lb 9.6 oz (67.4 kg)    SpO2 99%    BMI 25.91 kg/m   Wt Readings from Last 3 Encounters:  02/28/21 148 lb 9.6 oz (67.4 kg)  02/15/21 145 lb 3.2 oz (65.9 kg)  02/07/21 148 lb 9.6 oz (67.4 kg)    Physical Exam Vitals and nursing note reviewed.  Constitutional:      General: She is awake. She is not in acute distress.    Appearance: She is well-developed and well-groomed. She is not ill-appearing or toxic-appearing.  HENT:     Head: Normocephalic.     Right Ear: Hearing, tympanic membrane, ear canal and external ear normal.     Left Ear: Hearing, tympanic membrane, ear canal and external ear normal.     Nose: Nose normal.     Right  Sinus: No maxillary sinus tenderness or frontal sinus tenderness.     Left Sinus: No maxillary sinus tenderness or frontal sinus tenderness.     Mouth/Throat:     Mouth: Mucous membranes are moist.  Eyes:     General: Lids are normal.        Right eye: No discharge.        Left eye: No discharge.     Conjunctiva/sclera: Conjunctivae normal.     Pupils: Pupils are equal, round, and reactive to light.  Neck:     Thyroid: No thyromegaly.     Vascular: No carotid bruit.  Cardiovascular:     Rate and Rhythm: Normal rate and regular rhythm.     Heart sounds: Normal heart sounds. No murmur heard.   No gallop.  Pulmonary:     Effort: Pulmonary effort is normal. No accessory muscle  usage or respiratory distress.     Breath sounds: Normal breath sounds.  Abdominal:     General: Bowel sounds are normal.     Palpations: Abdomen is soft. There is no hepatomegaly or splenomegaly.  Musculoskeletal:     Right wrist: Tenderness (to lateral aspect) present. No swelling, lacerations, bony tenderness, snuff box tenderness or crepitus. Normal range of motion.     Left wrist: Normal.     Right hand: Swelling and tenderness (to lateral aspect hand with some swelling) present. No lacerations. Normal range of motion. Decreased strength of finger abduction. Normal sensation. Normal pulse.     Left hand: No swelling, lacerations or tenderness. Normal range of motion. Normal strength. Normal sensation. Normal pulse.     Cervical back: Normal range of motion and neck supple.     Right lower leg: No edema.     Left lower leg: No edema.  Lymphadenopathy:     Cervical: No cervical adenopathy.  Skin:    General: Skin is warm and dry.  Neurological:     Mental Status: She is alert and oriented to person, place, and time.     Cranial Nerves: Cranial nerves 2-12 are intact.     Coordination: Coordination is intact.     Gait: Gait is intact.     Deep Tendon Reflexes: Reflexes are normal and symmetric.     Reflex Scores:      Brachioradialis reflexes are 2+ on the right side and 2+ on the left side.      Patellar reflexes are 2+ on the right side and 2+ on the left side. Psychiatric:        Attention and Perception: Attention normal.        Mood and Affect: Mood normal.        Speech: Speech normal.        Behavior: Behavior normal. Behavior is cooperative.        Thought Content: Thought content normal.    Results for orders placed or performed in visit on 02/07/21  Novel Coronavirus, NAA ([facility])   Specimen: Nasopharyngeal(NP) swabs in vial transport medium  Result Value Ref Range   SARS-CoV-2, NAA Not Detected Not Detected  Rapid Strep screen([facility])   Specimen: Other    Other  Result Value Ref Range   Strep Gp A Ag, IA W/Reflex Negative Negative  Culture, Group A Strep   Other  Result Value Ref Range   Strep A Culture Negative   Veritor Flu A/B Waived  Result Value Ref Range   Influenza A Negative Negative   Influenza B Negative Negative  Assessment & Plan:   Problem List Items Addressed This Visit       Other   Right hand pain - Primary    Presenting to right hand and wrist after trauma to area.  Will obtain imaging of both right wrist and hand to r/o fracture, as concern for this with tenderness and edema noted lateral aspect right hand and wrist.  Recommend at this time to take Ibuprofen as needed, refills sent in.  May use Icy/Hot or Voltaren gel.  Recommend icing area and rest.  May wear a support to help with discomfort.  If fracture will refer to ortho for further recommendations.      Relevant Orders   DG Hand Complete Right   DG Wrist Complete Right     Follow up plan: Return if symptoms worsen or fail to improve.

## 2021-02-28 NOTE — Assessment & Plan Note (Signed)
Presenting to right hand and wrist after trauma to area.  Will obtain imaging of both right wrist and hand to r/o fracture, as concern for this with tenderness and edema noted lateral aspect right hand and wrist.  Recommend at this time to take Ibuprofen as needed, refills sent in.  May use Icy/Hot or Voltaren gel.  Recommend icing area and rest.  May wear a support to help with discomfort.  If fracture will refer to ortho for further recommendations.

## 2021-02-28 NOTE — Patient Instructions (Addendum)
[facility] outpatient imaging == [address] [place], [place] [address] = [phone]  Hand Pain Many things can cause hand pain. Some common causes are: An injury. Repeating the same movement with your hand over and over (overuse). Osteoporosis. Arthritis. Lumps in the tendons or joints of the hand and wrist (ganglion cysts). Nerve compression syndromes (carpal tunnel syndrome). Inflammation of the tendons (tendinitis). Infection. Follow these instructions at home: Pay attention to any changes in your symptoms. Take these actions to help with your discomfort: Managing pain, stiffness, and swelling  Take over-the-counter and prescription medicines only as told by your health care provider. Wear a hand splint or support as told by your health care provider. If directed, put ice on the affected area: Put ice in a plastic bag. Place a towel between your skin and the bag. Leave the ice on for 20 minutes, 2-3 times a day. Activity Take breaks from repetitive activity often. Avoid activities that make your pain worse. Minimize stress on your hands and wrists as much as possible. Do stretches or exercises as told by your health care provider. Do not do activities that make your pain worse. Contact a health care provider if: Your pain does not get better after a few days of self-care. Your pain gets worse. Your pain affects your ability to do your daily activities. Get help right away if: Your hand becomes warm, red, or swollen. Your hand is numb or tingling. Your hand is extremely swollen or deformed. Your hand or fingers turn white or blue. You cannot move your hand, wrist, or fingers. Summary Many things can cause hand pain. Contact your health care provider if your pain does not get better after a few days of self care. Minimize stress on your hands and wrists as much as possible. Do not do activities that make your pain worse. This information is not intended to replace advice given  to you by your health care provider. Make sure you discuss any questions you have with your health care provider. Document Revised: 06/09/2020 Document Reviewed: 06/09/2020 Elsevier Patient Education  2022 [employer].

## 2021-02-28 NOTE — Telephone Encounter (Signed)
Pt calling in.    Reason for Disposition  [1] SEVERE pain AND [2] not improved 2 hours after pain medicine/ice packs  Answer Assessment - Initial Assessment Questions 1. MECHANISM: "How did the injury happen?"     Yesterday I was working and got mad.   I slammed my hand down.   I had pain, numbness and tingling up my arm and into my armpit.   It's the 5th metatarsal I think is broken.   It's red and swollen.    Right hand. 2. ONSET: "When did the injury happen?" (Minutes or hours ago)      Yesterday 3. APPEARANCE of INJURY: "What does the injury look like?"      Red and swollen over.   I'm having pain up my armpit.  It's contineous. 4. SEVERITY: "Can you use the hand normally?" "Can you bend your fingers into a ball and then fully open them?"     I can but it's very painful 5. SIZE: For cuts, bruises, or swelling, ask: "How large is it?" (e.g., inches or centimeters;  entire hand or wrist)      No other bruises or cuts. 6. PAIN: "Is there pain?" If Yes, ask: "How bad is the pain?"  (Scale 1-10; or mild, moderate, severe)     Yes 7. TETANUS: For any breaks in the skin, ask: "When was the last tetanus booster?"     *No Answer* 8. OTHER SYMPTOMS: "Do you have any other symptoms?"      *No Answer* 9. PREGNANCY: "Is there any chance you are pregnant?" "When was your last menstrual period?"     *No Answer*  Protocols used: Hand and Wrist Injury-A-AH

## 2021-03-01 NOTE — Progress Notes (Signed)
Contacted via MyChart   Good morning Amanda Brown, your imaging returned showing no fracture to either hand or wrist.  Great news!!  I suspect more bruising and swelling to area.  At this time I recommend ice to area frequently during day + Voltaren gel application and rest as often as possible.  If no resting wear a compressed hand/wrist support sleeve.  Any questions?

## 2021-04-25 ENCOUNTER — Other Ambulatory Visit: Payer: Self-pay

## 2021-04-25 ENCOUNTER — Ambulatory Visit: Payer: Commercial Managed Care - PPO | Admitting: Nurse Practitioner

## 2021-04-25 ENCOUNTER — Encounter: Payer: Self-pay | Admitting: Nurse Practitioner

## 2021-04-25 VITALS — BP 103/66 | HR 72 | Temp 98.5°F | Wt 149.6 lb

## 2021-04-25 DIAGNOSIS — H6690 Otitis media, unspecified, unspecified ear: Secondary | ICD-10-CM | POA: Diagnosis not present

## 2021-04-25 MED ORDER — AMOXICILLIN 500 MG PO CAPS: 500.0000 mg | ORAL_CAPSULE | Freq: Two times a day (BID) | ORAL | 0 refills | Status: AC

## 2021-04-25 NOTE — Progress Notes (Signed)
BP 103/66    Pulse 72    Temp 98.5 F (36.9 C) (Oral)    Wt 149 lb 9.6 oz (67.9 kg)    SpO2 97%    BMI 26.08 kg/m    Subjective:    Patient ID: Amanda Brown, female    DOB: April 06, 1995, 26 y.o.   MRN: [id number]  HPI: Amanda Brown is a 26 y.o. female  Chief Complaint  Patient presents with   Ear Pain    Pt states she woke up with L ear pain and pressure this morning. States she has a history of ear infections    EAR PAIN Duration:  started today Involved ear(s): left Severity:  7/10  Quality:  sharp Fever: no Otorrhea: no Upper respiratory infection symptoms: no Pruritus: no Hearing loss: no Water immersion no Using Q-tips: no Recurrent otitis media: no Status: worse Treatments attempted: none  Relevant past medical, surgical, family and social history reviewed and updated as indicated. Interim medical history since our last visit reviewed. Allergies and medications reviewed and updated.  Review of Systems  Constitutional:  Negative for fever.  HENT:  Positive for ear pain. Negative for congestion and ear discharge.    Per HPI unless specifically indicated above     Objective:    BP 103/66    Pulse 72    Temp 98.5 F (36.9 C) (Oral)    Wt 149 lb 9.6 oz (67.9 kg)    SpO2 97%    BMI 26.08 kg/m   Wt Readings from Last 3 Encounters:  04/25/21 149 lb 9.6 oz (67.9 kg)  02/28/21 148 lb 9.6 oz (67.4 kg)  02/15/21 145 lb 3.2 oz (65.9 kg)    Physical Exam Vitals and nursing note reviewed.  Constitutional:      General: She is not in acute distress.    Appearance: Normal appearance. She is normal weight. She is not ill-appearing, toxic-appearing or diaphoretic.  HENT:     Head: Normocephalic.     Right Ear: Tympanic membrane and external ear normal.     Left Ear: External ear normal. Tympanic membrane is erythematous.     Nose: Nose normal.     Mouth/Throat:     Mouth: Mucous membranes are moist.     Pharynx: Oropharynx is clear.  Eyes:     General:        Right  eye: No discharge.        Left eye: No discharge.     Extraocular Movements: Extraocular movements intact.     Conjunctiva/sclera: Conjunctivae normal.     Pupils: Pupils are equal, round, and reactive to light.  Cardiovascular:     Rate and Rhythm: Normal rate and regular rhythm.     Heart sounds: No murmur heard. Pulmonary:     Effort: Pulmonary effort is normal. No respiratory distress.     Breath sounds: Normal breath sounds. No wheezing or rales.  Musculoskeletal:     Cervical back: Normal range of motion and neck supple.  Skin:    General: Skin is warm and dry.     Capillary Refill: Capillary refill takes less than 2 seconds.  Neurological:     General: No focal deficit present.     Mental Status: She is alert and oriented to person, place, and time. Mental status is at baseline.  Psychiatric:        Mood and Affect: Mood normal.        Behavior: Behavior normal.  Thought Content: Thought content normal.        Judgment: Judgment normal.    Results for orders placed or performed in visit on 02/07/21  Novel Coronavirus, NAA ([facility])   Specimen: Nasopharyngeal(NP) swabs in vial transport medium  Result Value Ref Range   SARS-CoV-2, NAA Not Detected Not Detected  Rapid Strep screen([facility])   Specimen: Other   Other  Result Value Ref Range   Strep Gp A Ag, IA W/Reflex Negative Negative  Culture, Group A Strep   Other  Result Value Ref Range   Strep A Culture Negative   Veritor Flu A/B Waived  Result Value Ref Range   Influenza A Negative Negative   Influenza B Negative Negative      Assessment & Plan:   Problem List Items Addressed This Visit   None Visit Diagnoses     Acute otitis media, unspecified otitis media type    -  Primary   Will treat with Amoxicillin. Complete course of antibiotics. Return to clinic if symptoms worsen or fail to improve.   Relevant Medications   amoxicillin (AMOXIL) 500 MG capsule        Follow up plan: Return  if symptoms worsen or fail to improve.

## 2021-06-30 ENCOUNTER — Encounter: Payer: Self-pay | Admitting: Nurse Practitioner

## 2021-06-30 ENCOUNTER — Ambulatory Visit: Payer: Commercial Managed Care - PPO | Admitting: Nurse Practitioner

## 2021-06-30 VITALS — BP 130/88 | HR 75 | Temp 98.1°F | Wt 153.8 lb

## 2021-06-30 DIAGNOSIS — M542 Cervicalgia: Secondary | ICD-10-CM | POA: Insufficient documentation

## 2021-06-30 DIAGNOSIS — F418 Other specified anxiety disorders: Secondary | ICD-10-CM | POA: Diagnosis not present

## 2021-06-30 MED ORDER — IBUPROFEN 800 MG PO TABS: 800.0000 mg | ORAL_TABLET | Freq: Three times a day (TID) | ORAL | 4 refills | Status: DC | PRN

## 2021-06-30 MED ORDER — LIDOCAINE 5 % EX PTCH: 1.0000 | MEDICATED_PATCH | CUTANEOUS | 0 refills | Status: DC

## 2021-06-30 MED ORDER — METHOCARBAMOL 750 MG PO TABS: 750.0000 mg | ORAL_TABLET | Freq: Three times a day (TID) | ORAL | 0 refills | Status: DC | PRN

## 2021-06-30 NOTE — Patient Instructions (Signed)
Cervical Sprain ?A cervical sprain is also called a neck sprain. It is a stretch or tear in one or more ligaments in the neck. Ligaments are tissues that connect bones to each other. ?Neck sprains can be mild, bad, or very bad. A very bad sprain in the neck can cause the bones in the neck to be unstable. This can damage the spinal cord. It can also cause serious problems in the brain, spinal cord, and nerves (nervous system). ?Most neck sprains heal in 4-6 weeks. It can take more or less time depending on: ?What caused the injury. ?The amount of injury. ?What are the causes? ?Neck sprains may be caused by trauma, such as: ?An injury from an accident in a vehicle such as a car or boat. ?A fall. ?The head and neck being moved front to back or side to side all of a sudden (whiplash injury). ?Mild neck sprains may be caused by wear and tear over time. ?What increases the risk? ?The following factors may make you more likely to develop this condition: ?Taking part in activities that put you at high risk of hurting your neck. These include: ?Contact sports. ?Car racing. ?Gymnastics. ?Diving. ?Taking risks when driving or riding in a vehicle such as a car or boat. ?Arthritis caused by wear and tear of the joints in the spine. ?The neck not being very strong or flexible. ?Having had a neck injury in the past. ?Poor posture. ?Spending a lot of time in certain positions that put stress on the neck. This may be from sitting at a computer for a long time. ?What are the signs or symptoms? ?Symptoms of this condition include: ?Your neck, shoulders, or upper back feeling: ?Painful or sore. ?Stiff. ?Tender. ?Swollen. ?Hot, or like it is burning. ?Sudden tightening of neck muscles (spasms). ?Not being able to move the neck very much. ?Headache. ?Feeling dizzy. ?Feeling like you may vomit, or vomiting. ?Having a hand or arm that: ?Feels weak. ?Loses feeling (feels numb). ?Tingles. ?You may get symptoms right away after injury, or you  may get them over a few days. In some cases, symptoms may go away with treatment and come back over time. ?How is this treated? ?This condition is treated by: ?Resting your neck. ?Icing the part of your neck that is hurt. ?Doing exercises to restore movement and strength to your neck (physical therapy). ?If there is no swelling, you may use heat therapy 2-3 days after the injury took place. If your injury is very bad, treatment may also include: ?Keeping your neck in place for a length of time. This may be done using: ?A neck collar. This supports your chin and the back of your head. ?A cervical traction device. This is a sling that holds up your head. The sling removes weight and pressure from your neck. It may also help to relieve pain. ?Medicines that help with: ?Pain. ?Irritation and swelling (inflammation). ?Medicines that help to relax your muscles (muscle relaxants). ?Surgery. This is rare. ?Follow these instructions at home: ?Medicines ? ?Take over-the-counter and prescription medicines only as told by your doctor. ?Ask your doctor if the medicine prescribed to you: ?Requires you to avoid driving or using heavy machinery. ?Can cause trouble pooping (constipation). You may need to take these actions to prevent or treat trouble pooping: ?Drink enough fluid to keep your pee (urine) pale yellow. ?Take over-the-counter or prescription medicines. ?Eat foods that are high in fiber. These include beans, whole grains, and fresh fruits and vegetables. ?Limit   foods that are high in fat and sugar. These include fried or sweet foods. ?If you have a neck collar: ?Wear it as told by your doctor. Do not take it off unless told. ?Ask your doctor before adjusting your collar. ?If you have long hair, keep it outside of the collar. ?Ask your doctor if you may take off the collar for cleaning and bathing. If you may take off the collar: ?Follow instructions about how to take it off safely. ?Clean it by hand with mild soap and  water. Let it air-dry fully. ?If your collar has pads that you can take out: ?Take the pads out every 1-2 days. ?Wash them by hand with soap and water. ?Let the pads air-dry fully before you put them back in the collar. ?Tell your doctor if your skin under the collar has irritation or sores. ?Managing pain, stiffness, and swelling ? ?  ? ?Use a cervical traction device, if told by your doctor. ?If told, put ice on the affected area. To do this: ?Put ice in a plastic bag. ?Place a towel between your skin and the bag. ?Leave the ice on for 20 minutes, 2-3 times a day. ?If told, put heat on the affected area. Do this before exercise or as often as told by your doctor. Use the heat source that your doctor recommends, such as a moist heat pack or a heating pad. ?Place a towel between your skin and the heat source. ?Leave the heat on for 20-30 minutes. ?Take the heat off if your skin turns bright red. This is very important if you cannot feel pain, heat, or cold. You may have a greater risk of getting burned. ?Activity ?Do not drive while wearing a neck collar. If you do not have a neck collar, ask if it is safe to drive while your neck heals. ?Do not lift anything that is heavier than 10 lb (4.5 kg), or the limit that you are told, until your doctor tells you that it is safe. ?Rest as told by your doctor. ?Do exercises as told by your doctor or physical therapist. ?Return to your normal activities as told by your doctor. Avoid positions and activities that make you feel worse. Ask your doctor what activities are safe for you. ?General instructions ?Do not use any products that contain nicotine or tobacco, such as cigarettes, e-cigarettes, and chewing tobacco. These can delay healing. If you need help quitting, ask your doctor. ?Keep all follow-up visits as told by your doctor or physical therapist. This is important. ?How is this prevented? ?To prevent a neck sprain from happening again: ?Practice good posture. Adjust  your workstation to help you do this. ?Exercise regularly as told by your doctor or physical therapist. ?Avoid activities that are risky or may cause a neck sprain. ?Contact a doctor if: ?Your symptoms get worse. ?Your symptoms do not get better after 2 weeks of treatment. ?Your pain gets worse. ?Medicine does not help your pain. ?You have new symptoms that you cannot explain. ?Your neck collar gives you sores on your skin or bothers your skin. ?Get help right away if: ?You have very bad pain. ?You get any of the following in any part of your body: ?Loss of feeling. ?Tingling. ?Weakness. ?You cannot move a part of your body. ?You have neck pain and either of these: ?Very bad dizziness. ?A very bad headache. ?Summary ?A cervical sprain is also called a neck sprain. It is a stretch or tear in one or more  ligaments in the neck. Ligaments are tissues that connect bones. ?Neck sprains may be caused by trauma, such as an injury or a fall. ?You may get symptoms right away after injury, or you may get them over a few days. ?Neck sprains may be treated with rest, heat, ice, medicines, exercise, and surgery. ?This information is not intended to replace advice given to you by your health care provider. Make sure you discuss any questions you have with your health care provider. ?Document Revised: 10/29/2018 Document Reviewed: 10/29/2018 ?Elsevier Patient Education ? [employer]. ? ?

## 2021-06-30 NOTE — Assessment & Plan Note (Signed)
Due to work stressors, she is looking for alternate work.  PHQ 9 = 12 and GAD 7 = 7 today.  Discussed at length with patient.  She denies SI/HI.  Refuses medication at this time, but have recommended if worsening she consider medication or therapy. ?

## 2021-06-30 NOTE — Assessment & Plan Note (Signed)
Acute for 2 days, suspect more musculoskeletal in nature based on assessment.  At this time provided sample of Voltaren gel, recommend gentle massage a few time a day and place cream.  Script for Robaxin, Lidocaine patches, and Ibuprofen sent to use as needed.  Recommend alternate ice and heat as needed.  Ensure taking breaks from work to stretch neck during day.  Return to office for worsening or ongoing symptoms.  No red flags noted. ?

## 2021-06-30 NOTE — Progress Notes (Signed)
? ?BP 130/88   Pulse 75   Temp 98.1 ?F (36.7 ?C) (Oral)   Wt 153 lb 12.8 oz (69.8 kg)   LMP 05/30/2021   SpO2 98%   BMI 26.82 kg/m?   ? ?Subjective:  ? ? Patient ID: Amanda Brown, female    DOB: 13-May-1995, 26 y.o.   MRN: [id number] ? ?HPI: ?Amanda Brown is a 26 y.o. female ? ?Chief Complaint  ?Patient presents with  ? Neck Pain  ?  X 2 days. No fall/injury. Pain when turning head towards the right or facing downwards.   ? ?NECK PAIN  ?Started a couple days ago, left side of neck - woke up with this.  Took some leftover 800 MG Ibuprofen, did not help with discomfort.  Took a little more Ibuprofen, which did not help.  No recent injuries or falls.  Has not been sick recently.  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 ? ?  06/30/2021  ?  3:14 PM 04/25/2021  ?  9:55 AM 02/15/2021  ? 11:39 AM 02/07/2021  ? 11:31 AM 01/18/2021  ? 10:52 AM  ?Depression screen PHQ 2/9  ?Decreased Interest 2 2 0 0 0  ?Down, Depressed, Hopeless 3 2 0 1 0  ?PHQ - 2 Score 5 4 0 1 0  ?Altered sleeping 1 2 1 1 1   ?Tired, decreased energy 1 2 1  0 1  ?Change in appetite 2 0 0 0 0  ?Feeling bad or failure about yourself  2 0 1 1 0  ?Trouble concentrating 0 0 0 0 0  ?Moving slowly or fidgety/restless 0 0 0 0 0  ?Suicidal thoughts 1 0 0 0 0  ?PHQ-9 Score 12 8 3 3 2   ?Difficult doing work/chores Somewhat difficult Somewhat difficult Not difficult at all    ?  ? ?  06/30/2021  ?  3:14 PM 04/25/2021  ?  9:55 AM 02/15/2021  ? 11:39 AM 02/07/2021  ? 11:31 AM  ?GAD 7 : Generalized Anxiety Score  ?Nervous, Anxious, on Edge 0 0 0 0  ?Control/stop worrying 3 0 0  0  ?Worry too much - different things 1 0 0 0  ?Trouble relaxing 0 0 1 0  ?Restless 0 0 0 0  ?Easily annoyed or irritable 0 1 0 0  ?Afraid - awful might happen 3 0 0 0  ?Total GAD 7 Score 7 1 1  0  ?Anxiety Difficulty Somewhat difficult Not difficult at all Not difficult at all Not difficult at all  ? ? ?Relevant past medical, surgical, family and social history reviewed and updated as indicated. Interim medical history since our last visit reviewed. ?Allergies and medications reviewed and updated. ? ?Review of Systems  ?Constitutional:  Negative for activity change, appetite change, diaphoresis, fatigue and fever.  ?Respiratory:  Negative for cough, chest tightness, shortness of breath and wheezing.   ?Cardiovascular:  Negative for chest pain, palpitations and leg swelling.  ?Gastrointestinal: Negative.   ?Musculoskeletal:  Positive for neck pain.  ?Neurological: Negative.   ?Psychiatric/Behavioral: Negative.    ? ?Per HPI unless specifically indicated above ? ?   ?Objective:  ?  ?BP 130/88  Pulse 75   Temp 98.1 ?F (36.7 ?C) (Oral)   Wt 153 lb 12.8 oz (69.8 kg)   LMP 05/30/2021   SpO2 98%   BMI 26.82 kg/m?   ?Wt Readings from Last 3 Encounters:  ?06/30/21 153 lb 12.8 oz (69.8 kg)  ?04/25/21 149 lb 9.6 oz (67.9 kg)  ?02/28/21 148 lb 9.6 oz (67.4 kg)  ?  ?Physical Exam ?Vitals and nursing note reviewed.  ?Constitutional:   ?   General: She is awake. She is not in acute distress. ?   Appearance: She is well-developed and well-groomed. She is not ill-appearing or toxic-appearing.  ?HENT:  ?   Head: Normocephalic.  ?   Right Ear: Hearing normal.  ?   Left Ear: Hearing normal.  ?Eyes:  ?   General: Lids are normal.     ?   Right eye: No discharge.     ?   Left eye: No discharge.  ?   Conjunctiva/sclera: Conjunctivae normal.  ?   Pupils: Pupils are equal, round, and reactive to light.  ?Neck:  ?   Vascular: No carotid bruit.  ?Cardiovascular:  ?   Rate and Rhythm: Normal rate and regular rhythm.  ?   Heart sounds:  Normal heart sounds. No murmur heard. ?  No gallop.  ?Pulmonary:  ?   Effort: Pulmonary effort is normal.  ?   Breath sounds: Normal breath sounds.  ?Abdominal:  ?   General: Bowel sounds are normal.  ?   Palpations: Abdomen is soft. There is no hepatomegaly or splenomegaly.  ?Musculoskeletal:  ?   Cervical back: Neck supple. No edema, erythema, rigidity, torticollis or crepitus. Pain with movement and muscular tenderness (along trapezius) present. No spinous process tenderness. Normal range of motion.  ?   Right lower leg: No edema.  ?   Left lower leg: No edema.  ?Skin: ?   General: Skin is warm and dry.  ?Neurological:  ?   Mental Status: She is alert and oriented to person, place, and time.  ?Psychiatric:     ?   Attention and Perception: Attention normal.     ?   Mood and Affect: Mood normal.     ?   Behavior: Behavior normal. Behavior is cooperative.     ?   Thought Content: Thought content normal.     ?   Judgment: Judgment normal.  ? ?Results for orders placed or performed in visit on 02/07/21  ?Novel Coronavirus, NAA ([facility])  ? Specimen: Nasopharyngeal(NP) swabs in vial transport medium  ?Result Value Ref Range  ? SARS-CoV-2, NAA Not Detected Not Detected  ?Rapid Strep screen([facility])  ? Specimen: Other  ? Other  ?Result Value Ref Range  ? Strep Gp A Ag, IA W/Reflex Negative Negative  ?Culture, Group A Strep  ? Other  ?Result Value Ref Range  ? Strep A Culture Negative   ?Veritor Flu A/B Waived  ?Result Value Ref Range  ? Influenza A Negative Negative  ? Influenza B Negative Negative  ? ?   ?Assessment & Plan:  ? ?Problem List Items Addressed This Visit   ? ?  ? Other  ? Depression with anxiety  ?  Due to work stressors, she is looking for alternate work.  PHQ 9 = 12 and GAD 7 = 7 today.  Discussed at length with patient.  She denies SI/HI.  Refuses medication at this time, but have recommended if worsening she consider medication or therapy. ? ?  ?  ?  Neck pain - Primary  ?  Acute for 2 days,  suspect more musculoskeletal in nature based on assessment.  At this time provided sample of Voltaren gel, recommend gentle massage a few time a day and place cream.  Script for Robaxin, Lidocaine patches, and Ibuprofen sent to use as needed.  Recommend alternate ice and heat as needed.  Ensure taking breaks from work to stretch neck during day.  Return to office for worsening or ongoing symptoms.  No red flags noted. ? ?  ?  ?  ? ?Follow up plan: ?Return if symptoms worsen or fail to improve. ? ? ? ? ? ?

## 2021-07-14 ENCOUNTER — Encounter: Payer: Self-pay | Admitting: Emergency Medicine

## 2021-07-14 ENCOUNTER — Emergency Department: Payer: Commercial Managed Care - PPO

## 2021-07-14 ENCOUNTER — Emergency Department
Admission: EM | Admit: 2021-07-14 | Discharge: 2021-07-14 | Disposition: A | Discharge status: Home / Self Care | Payer: Commercial Managed Care - PPO | Attending: Emergency Medicine | Admitting: Emergency Medicine

## 2021-07-14 ENCOUNTER — Ambulatory Visit: Payer: Self-pay | Admitting: *Deleted

## 2021-07-14 ENCOUNTER — Other Ambulatory Visit: Payer: Self-pay

## 2021-07-14 DIAGNOSIS — R110 Nausea: Secondary | ICD-10-CM | POA: Insufficient documentation

## 2021-07-14 DIAGNOSIS — R1032 Left lower quadrant pain: Secondary | ICD-10-CM | POA: Insufficient documentation

## 2021-07-14 LAB — URINALYSIS, ROUTINE W REFLEX MICROSCOPIC
Bilirubin Urine: NEGATIVE
Glucose, UA: NEGATIVE mg/dL
Hgb urine dipstick: NEGATIVE
Ketones, ur: NEGATIVE mg/dL
Leukocytes,Ua: NEGATIVE
Nitrite: NEGATIVE
Protein, ur: NEGATIVE mg/dL
Specific Gravity, Urine: 1.018 (ref 1.005–1.030)
pH: 6 (ref 5.0–8.0)

## 2021-07-14 LAB — CBC
HCT: 36.4 % (ref 36.0–46.0)
Hemoglobin: 11.9 g/dL — ABNORMAL LOW (ref 12.0–15.0)
MCH: 27.7 pg (ref 26.0–34.0)
MCHC: 32.7 g/dL (ref 30.0–36.0)
MCV: 84.8 fL (ref 80.0–100.0)
Platelets: 383 10*3/uL (ref 150–400)
RBC: 4.29 MIL/uL (ref 3.87–5.11)
RDW: 13.2 % (ref 11.5–15.5)
WBC: 6.4 10*3/uL (ref 4.0–10.5)
nRBC: 0 % (ref 0.0–0.2)

## 2021-07-14 LAB — COMPREHENSIVE METABOLIC PANEL
ALT: 30 U/L (ref 0–44)
AST: 22 U/L (ref 15–41)
Albumin: 4.3 g/dL (ref 3.5–5.0)
Alkaline Phosphatase: 42 U/L (ref 38–126)
Anion gap: 5 (ref 5–15)
BUN: 9 mg/dL (ref 6–20)
CO2: 26 mmol/L (ref 22–32)
Calcium: 9.3 mg/dL (ref 8.9–10.3)
Chloride: 107 mmol/L (ref 98–111)
Creatinine, Ser: 0.81 mg/dL (ref 0.44–1.00)
GFR, Estimated: >60 mL/min (ref >60)
Glucose, Bld: 105 mg/dL — ABNORMAL HIGH (ref 70–99)
Potassium: 4.2 mmol/L (ref 3.5–5.1)
Sodium: 138 mmol/L (ref 135–145)
Total Bilirubin: 0.4 mg/dL (ref 0.3–1.2)
Total Protein: 7.3 g/dL (ref 6.5–8.1)

## 2021-07-14 LAB — LIPASE, BLOOD: Lipase: 35 U/L (ref 11–51)

## 2021-07-14 LAB — POC URINE PREG, ED: Preg Test, Ur: NEGATIVE

## 2021-07-14 MED ORDER — OXYCODONE-ACETAMINOPHEN 5-325 MG PO TABS
1.0000 | ORAL_TABLET | Freq: Once | ORAL | Status: AC
  Administered 2021-07-14: 1 via ORAL
  Filled 2021-07-14: qty 1

## 2021-07-14 MED ORDER — ONDANSETRON 4 MG PO TBDP
4.0000 mg | ORAL_TABLET | Freq: Once | ORAL | Status: AC
  Administered 2021-07-14: 4 mg via ORAL
  Filled 2021-07-14: qty 1

## 2021-07-14 NOTE — Discharge Instructions (Signed)
We did not find any explanation for your pain.  Your blood work urine sample and CT of your abdomen was all reassuring.  Please follow-up with your primary care provider.  You can take 400 mg of ibuprofen every 6 hours and use a heating pad.  If your pain is worsening or you develop any new symptoms that are concerning to you please return to the emergency department. ?

## 2021-07-14 NOTE — ED Provider Notes (Signed)
? ?[facility] ?Provider Note ? ? ? Event Date/Time  ? First MD Initiated Contact with Patient 07/14/21 1032   ?  (approximate) ? ? ?History  ? ?Abdominal Pain ? ? ?HPI ? ?Kemiah Booz is a 26 y.o. female  with no pmh who presents with abdominal pain.  Symptoms started last night and have persisted overnight.  Pain is located in the left lower quadrant does not radiate.  Has been rather constant and is associated with nausea but no vomiting no diarrhea constipation last BM was yesterday.  She denies urinary symptoms abnormal vaginal bleeding or vaginal discharge.  No fevers chills.  Patient has had this pain multiple times in the past about 5 total but has never seen a physician for it.  Does not correlate with her menstrual cycle. ? ?  ? ?History reviewed. No pertinent past medical history. ? ?Patient Active Problem List  ? Diagnosis Date Noted  ? Neck pain 06/30/2021  ? Depression with anxiety 06/30/2021  ? Right hand pain 02/28/2021  ? Family history of exposure to [facility] 01/18/2021  ? Palpitations with regular cardiac rhythm 01/18/2021  ? Abnormal leg movement 01/18/2021  ? Cubital tunnel syndrome on left 01/18/2021  ? ? ? ?Physical Exam  ?Triage Vital Signs: ?ED Triage Vitals [07/14/21 0938]  ?Enc Vitals Group  ?   BP (!) 136/92  ?   Pulse Rate 78  ?   Resp 18  ?   Temp 98.5 ?F (36.9 ?C)  ?   Temp Source Oral  ?   SpO2 97 %  ?   Weight 135 lb (61.2 kg)  ?   Height 5\' 3"  (1.6 m)  ?   Head Circumference   ?   Peak Flow   ?   Pain Score 8  ?   Pain Loc   ?   Pain Edu?   ?   Excl. in GC?   ? ? ?Most recent vital signs: ?Vitals:  ? 07/14/21 0938 07/14/21 1055  ?BP: (!) 136/92 130/88  ?Pulse: 78 80  ?Resp: 18 18  ?Temp: 98.5 ?F (36.9 ?C)   ?SpO2: 97% 98%  ? ? ? ?General: Awake, no distress.  Patient appears well ?CV:  Good peripheral perfusion.  ?Resp:  Normal effort.  ?Abd:  No distention.  Mild tenderness to deep palpation in the left lower quadrant no guarding ?Neuro:             Awake,  Alert, Oriented x 3  ?Other:   ? ? ?ED Results / Procedures / Treatments  ?Labs ?(all labs ordered are listed, but only abnormal results are displayed) ?Labs Reviewed  ?COMPREHENSIVE METABOLIC PANEL - Abnormal; Notable for the following components:  ?    Result Value  ? Glucose, Bld 105 (*)   ? All other components within normal limits  ?CBC - Abnormal; Notable for the following components:  ? Hemoglobin 11.9 (*)   ? All other components within normal limits  ?URINALYSIS, ROUTINE W REFLEX MICROSCOPIC - Abnormal; Notable for the following components:  ? Color, Urine YELLOW (*)   ? APPearance CLEAR (*)   ? All other components within normal limits  ?LIPASE, BLOOD  ?POC URINE PREG, ED  ? ? ? ?EKG ? ? ? ? ?RADIOLOGY ? ? ? ?PROCEDURES: ? ?Critical Care performed: No ? ?Procedures ? ? ? ?MEDICATIONS ORDERED IN ED: ?Medications  ?ondansetron (ZOFRAN-ODT) disintegrating tablet 4 mg (4 mg Oral Given 07/14/21 1054)  ?oxyCODONE-acetaminophen (PERCOCET/ROXICET)  5-325 MG per tablet 1 tablet (1 tablet Oral Given 07/14/21 1054)  ? ? ? ?IMPRESSION / MDM / ASSESSMENT AND PLAN / ED COURSE  ?I reviewed the triage vital signs and the nursing notes. ?             ?               ? ?Differential diagnosis includes, but is not limited to, diverticulitis, renal colic, ovarian cyst/torsion less likely pancreatitis, ? ?Patient is a 26 year old female presenting with recurrent episode of left lower quadrant abdominal pain has been since last night is associated with nausea but no vomiting.  Vital signs are within normal limits.  She appears quite well.  Abdomen is tender in the left lower quadrant but overall is benign soft and no guarding.  I reviewed her labs which are overall reassuring she has no white count normal lipase UA does not suggest infection and has no RBCs.  Pregnancy test is negative.  Given the ongoing pain will obtain a CT abdomen pelvis without contrast.  We will treat her pain with oral Percocet and nausea with ODT  Zofran. ? ?CT of the abdomen does not show any acute pathology.  Appendix visualized and is normal no ovarian pathology.  Overall with her reassuring work-up and clinical appearance I think she is appropriate for discharge.  We discussed return precautions as well as PCP follow-up. ? ? ?FINAL CLINICAL IMPRESSION(S) / ED DIAGNOSES  ? ?Final diagnoses:  ?Left lower quadrant abdominal pain  ? ? ? ?Rx / DC Orders  ? ?ED Discharge Orders   ? ? None  ? ?  ? ? ? ?Note:  This document was prepared using Dragon voice recognition software and may include unintentional dictation errors. ?  ?Georga Hacking, MD ?07/14/21 1224 ? ?

## 2021-07-14 NOTE — ED Notes (Signed)
See triage note  presents some left sided abd pain  states pain woke up early am  positive nausea  no fever or urinary sx's ?

## 2021-07-14 NOTE — Telephone Encounter (Signed)
Reason for Disposition ? [1] SEVERE pain (e.g., excruciating) AND [2] present > 1 hour ? ?Answer Assessment - Initial Assessment Questions ?1. LOCATION: "Where does it hurt?"  ?    Left upper side ?2. RADIATION: "Does the pain shoot anywhere else?" (e.g., chest, back) ?    no ?3. ONSET: "When did the pain begin?" (e.g., minutes, hours or days ago)  ?    Last night ?4. SUDDEN: "Gradual or sudden onset?" ?    sudden ?5. PATTERN "Does the pain come and go, or is it constant?" ?   - If constant: "Is it getting better, staying the same, or worsening?"  ?    (Note: Constant means the pain never goes away completely; most serious pain is constant and it progresses)  ?   - If intermittent: "How long does it last?" "Do you have pain now?" ?    (Note: Intermittent means the pain goes away completely between bouts) ?    Cramping- constant ?6. SEVERITY: "How bad is the pain?"  (e.g., Scale 1-10; mild, moderate, or severe) ?  - MILD (1-3): doesn't interfere with normal activities, abdomen soft and not tender to touch  ?  - MODERATE (4-7): interferes with normal activities or awakens from sleep, abdomen tender to touch  ?  - SEVERE (8-10): excruciating pain, doubled over, unable to do any normal activities  ?    severe ?7. RECURRENT SYMPTOM: "Have you ever had this type of stomach pain before?" If Yes, ask: "When was the last time?" and "What happened that time?"  ?    Yes- did not have evaluation ?8. CAUSE: "What do you think is causing the stomach pain?" ?    no ?9. RELIEVING/AGGRAVATING FACTORS: "What makes it better or worse?" (e.g., movement, antacids, bowel movement) ?    no ?10. OTHER SYMPTOMS: "Do you have any other symptoms?" (e.g., back pain, diarrhea, fever, urination pain, vomiting) ?      nausea ?11. PREGNANCY: "Is there any chance you are pregnant?" "When was your last menstrual period?" ?      No- LMP-end of April ? ?Protocols used: Abdominal Pain - Female-A-AH, Abdominal Pain - Upper-A-AH ? ?

## 2021-07-14 NOTE — ED Triage Notes (Signed)
Pt here with abd pain that started last night. Pt states pain is on the left and does not radiate. Pt denies V/D but some nausea. Pt last BM was 07/13/2021. ?

## 2021-07-14 NOTE — Telephone Encounter (Signed)
?  Chief Complaint: abdominal pain- upper left ?Symptoms: sudden, severe pain- woke patient from sleep- not better-hours, nausea ?Frequency: started during the night ?Pertinent Negatives: Patient denies  back pain, diarrhea, fever, urination pain, vomiting ?Disposition: [x] ED /[] Urgent Care (no appt availability in office) / [] Appointment(In office/virtual)/ []  [place] [facility]/ [] Home Care/ [] Refused Recommended Disposition /[] [place] [facility]/ []  Follow-up with PCP ?Additional Notes:    ?

## 2021-07-24 ENCOUNTER — Ambulatory Visit: Payer: Self-pay | Admitting: *Deleted

## 2021-07-24 NOTE — Telephone Encounter (Signed)
Reason for Disposition  [1] MODERATE pain (e.g., interferes with normal activities) AND [2] pain comes and goes (cramps) AND [3] present > 24 hours  (Exception: pain with Vomiting or Diarrhea - see that Guideline)  Answer Assessment - Initial Assessment Questions 1. LOCATION: "Where does it hurt?"      Having pain in left lower abd.   Same pain I had 2 wks ago.   I was referred to ED.   They sent ibuprofen which I used.   The pain keeps coming back.     I had some diarrhea this morning. 2. RADIATION: "Does the pain shoot anywhere else?" (e.g., chest, back)     No   Just one area.   It feels like cramping. 3. ONSET: "When did the pain begin?" (e.g., minutes, hours or days ago)      2 wks ago when I was sent to ED.  CT scan was done and did not see anything.   Think it was a stomach bug.    It's intermittent. 4. SUDDEN: "Gradual or sudden onset?"     *No Answer* 5. PATTERN "Does the pain come and go, or is it constant?"    - If constant: "Is it getting better, staying the same, or worsening?"      (Note: Constant means the pain never goes away completely; most serious pain is constant and it progresses)     - If intermittent: "How long does it last?" "Do you have pain now?"     (Note: Intermittent means the pain goes away completely between bouts)     Intermittent 6. SEVERITY: "How bad is the pain?"  (e.g., Scale 1-10; mild, moderate, or severe)   - MILD (1-3): doesn't interfere with normal activities, abdomen soft and not tender to touch    - MODERATE (4-7): interferes with normal activities or awakens from sleep, abdomen tender to touch    - SEVERE (8-10): excruciating pain, doubled over, unable to do any normal activities      8/10 7. RECURRENT SYMPTOM: "Have you ever had this type of stomach pain before?" If Yes, ask: "When was the last time?" and "What happened that time?"       8. CAUSE: "What do you think is causing the stomach pain?"     I don't know 9. RELIEVING/AGGRAVATING FACTORS:  "What makes it better or worse?" (e.g., movement, antacids, bowel movement)      10. OTHER SYMPTOMS: "Do you have any other symptoms?" (e.g., back pain, diarrhea, fever, urination pain, vomiting)       Diarrhea this morning 11. PREGNANCY: "Is there any chance you are pregnant?" "When was your last menstrual period?"       No  Protocols used: Abdominal Pain - [facility]

## 2021-07-24 NOTE — Telephone Encounter (Signed)
  Chief Complaint: Left lower abd pain on and off for 2 wks.   Seen in ED 2 wks ago for it.   Symptoms: above Frequency: above Pertinent Negatives: Patient denies vaginal bleeding. Disposition: [] ED /[] Urgent Care (no appt availability in office) / [x] Appointment(In office/virtual)/ []  [place] [facility]/ [] Home Care/ [] Refused Recommended Disposition /[] [place] [facility]/ []  Follow-up with PCP Additional Notes: Appt made with , NP for 07/25/2021.  ED precautions given.

## 2021-07-25 ENCOUNTER — Telehealth: Payer: Self-pay

## 2021-07-25 ENCOUNTER — Encounter: Payer: Self-pay | Admitting: Nurse Practitioner

## 2021-07-25 ENCOUNTER — Ambulatory Visit: Payer: Commercial Managed Care - PPO | Admitting: Nurse Practitioner

## 2021-07-25 VITALS — BP 112/76 | HR 80 | Temp 98.3°F | Ht 63.0 in | Wt 158.4 lb

## 2021-07-25 DIAGNOSIS — R1032 Left lower quadrant pain: Secondary | ICD-10-CM | POA: Diagnosis not present

## 2021-07-25 LAB — WET PREP FOR TRICH, YEAST, CLUE
Clue Cell Exam: NEGATIVE
Trichomonas Exam: NEGATIVE
Yeast Exam: NEGATIVE

## 2021-07-25 MED ORDER — DICYCLOMINE HCL 10 MG PO CAPS: 10.0000 mg | ORAL_CAPSULE | Freq: Three times a day (TID) | ORAL | 12 refills | Status: DC

## 2021-07-25 NOTE — Patient Instructions (Signed)
Abdominal Pain, Adult Many things can cause belly (abdominal) pain. Most times, belly pain is not dangerous. Many cases of belly pain can be watched and treated at home. Sometimes, though, belly pain is serious. Your doctor will try to find the cause of your belly pain. Follow these instructions at home:  Medicines Take over-the-counter and prescription medicines only as told by your doctor. Do not take medicines that help you poop (laxatives) unless told by your doctor. General instructions Watch your belly pain for any changes. Drink enough fluid to keep your pee (urine) pale yellow. Keep all follow-up visits as told by your doctor. This is important. Contact a doctor if: Your belly pain changes or gets worse. You are not hungry, or you lose weight without trying. You are having trouble pooping (constipated) or have watery poop (diarrhea) for more than 2-3 days. You have pain when you pee or poop. Your belly pain wakes you up at night. Your pain gets worse with meals, after eating, or with certain foods. You are vomiting and cannot keep anything down. You have a fever. You have blood in your pee. Get help right away if: Your pain does not go away as soon as your doctor says it should. You cannot stop vomiting. Your pain is only in areas of your belly, such as the right side or the left lower part of the belly. You have bloody or black poop, or poop that looks like tar. You have very bad pain, cramping, or bloating in your belly. You have signs of not having enough fluid or water in your body (dehydration), such as: Dark pee, very little pee, or no pee. Cracked lips. Dry mouth. Sunken eyes. Sleepiness. Weakness. You have trouble breathing or chest pain. Summary Many cases of belly pain can be watched and treated at home. Watch your belly pain for any changes. Take over-the-counter and prescription medicines only as told by your doctor. Contact a doctor if your belly pain  changes or gets worse. Get help right away if you have very bad pain, cramping, or bloating in your belly. This information is not intended to replace advice given to you by your health care provider. Make sure you discuss any questions you have with your health care provider. Document Revised: 06/30/2018 Document Reviewed: 06/30/2018 Elsevier Patient Education  2023 [employer]  

## 2021-07-25 NOTE — Assessment & Plan Note (Signed)
Ongoing for several months with recent imaging and labs reassuring.  Wet prep today negative.  Will repeat CBC, CMP, and check TSH.  Suspect some IBS present, has increased stressors recently with work.  Trial Bentyl 10 MG before meals and at bedtime.  Recommend keeping food diary and monitor for trigger items.  Could consider Alpha Gal and Celiac testing if ongoing.  Plan for return in 4 weeks for follow-up.

## 2021-07-25 NOTE — Progress Notes (Signed)
BP 112/76   Pulse 80   Temp 98.3 F (36.8 C) (Oral)   Ht 5\' 3"  (1.6 m)   Wt 158 lb 6.4 oz (71.8 kg)   SpO2 99%   BMI 28.06 kg/m    Subjective:    Patient ID: Amanda Brown Zabriskie, female    DOB: 09/28/1995, 26 y.o.   MRN: [id number]  HPI: Amanda Brown Juran is a 26 y.o. female  Chief Complaint  Patient presents with   Abdominal Pain    Patient is here for abdominal pain and cramping on lower left side. Patient states the pain has been coming and going off and on. Patient states she went to the ER and they did a CT scan and she says that it was normal. Patient was prescribed Ibuprofen 800 mg as needed and she is still having pain and discomfort. Patient states some days are better than other days. Patient states some times it will wake her up out of her sleep.    ABDOMINAL PAIN  To LLQ to left mid quadrant -- was seen in ER for this on 07/14/21 associated with nausea but no vomiting or diarrhea.  Has had pain intermittently over past months, about 5 episodes.  Had CT scan in ER which was overall reassuring with no acute findings.  Overall labs reassuring from ER, including urinalysis.  Sometimes this pain wakes her up at night and can not get comfortable.  Occasionally notices discomfort while eating and after eating.  Sometimes will have bloating and gas, at times more after meals.    Has a bowel movement two to three times a week.  Does not strain with these -- sometimes diarrhea and sometimes more soft.  Eats 2 meals a day -- often skips breakfast -- eats lunch and dinner.   Duration:months Onset: sudden Severity: 7/10 Quality:  burning, dull, aching, cramping, and throbbing Location:  LLQ and diffuse to left middle and upper too Episode duration: constant aching Radiation: no Frequency: constant Alleviating factors:  Aggravating factors: Status: fluctuating Treatments attempted:  Ibuprofen Fever: no Nausea: yes Vomiting: no Weight loss: no Decreased appetite: no Diarrhea:  yesterday had  episode Constipation: no Blood in stool: no Heartburn: no Jaundice: no Rash: no Dysuria/urinary frequency: no Hematuria: no History of sexually transmitted disease: no Recurrent NSAID use: no   Relevant past medical, surgical, family and social history reviewed and updated as indicated. Interim medical history since our last visit reviewed. Allergies and medications reviewed and updated.  Review of Systems  Constitutional:  Negative for activity change, appetite change, diaphoresis, fatigue and fever.  Respiratory:  Negative for cough, chest tightness, shortness of breath and wheezing.   Cardiovascular:  Negative for chest pain, palpitations and leg swelling.  Gastrointestinal:  Positive for abdominal distention, abdominal pain and nausea. Negative for blood in stool, constipation, diarrhea and vomiting.  Neurological: Negative.   Psychiatric/Behavioral: Negative.     Per HPI unless specifically indicated above     Objective:    BP 112/76   Pulse 80   Temp 98.3 F (36.8 C) (Oral)   Ht 5\' 3"  (1.6 m)   Wt 158 lb 6.4 oz (71.8 kg)   SpO2 99%   BMI 28.06 kg/m   Wt Readings from Last 3 Encounters:  07/25/21 158 lb 6.4 oz (71.8 kg)  07/14/21 135 lb (61.2 kg)  06/30/21 153 lb 12.8 oz (69.8 kg)    Physical Exam Vitals and nursing note reviewed.  Constitutional:      General: She  is awake. She is not in acute distress.    Appearance: She is well-developed and well-groomed. She is not ill-appearing or toxic-appearing.  HENT:     Head: Normocephalic.     Right Ear: Hearing normal.     Left Ear: Hearing normal.  Eyes:     General: Lids are normal.        Right eye: No discharge.        Left eye: No discharge.     Conjunctiva/sclera: Conjunctivae normal.     Pupils: Pupils are equal, round, and reactive to light.  Neck:     Thyroid: No thyromegaly.     Vascular: No carotid bruit.  Cardiovascular:     Rate and Rhythm: Normal rate and regular rhythm.     Heart sounds:  Normal heart sounds. No murmur heard.   No gallop.  Pulmonary:     Effort: Pulmonary effort is normal. No accessory muscle usage or respiratory distress.     Breath sounds: Normal breath sounds.  Abdominal:     General: Bowel sounds are normal. There is no distension.     Palpations: Abdomen is soft.     Tenderness: There is generalized abdominal tenderness. There is no right CVA tenderness, left CVA tenderness or guarding.     Hernia: No hernia is present.     Comments: Tenderness mainly to left middle aspect abdomen.  Musculoskeletal:     Cervical back: Normal range of motion and neck supple.     Right lower leg: No edema.     Left lower leg: No edema.  Skin:    General: Skin is warm and dry.     Findings: No rash.  Neurological:     Mental Status: She is alert and oriented to person, place, and time.     Deep Tendon Reflexes: Reflexes are normal and symmetric.     Reflex Scores:      Brachioradialis reflexes are 2+ on the right side and 2+ on the left side.      Patellar reflexes are 2+ on the right side and 2+ on the left side. Psychiatric:        Attention and Perception: Attention normal.        Mood and Affect: Mood normal.        Speech: Speech normal.        Behavior: Behavior normal. Behavior is cooperative.        Thought Content: Thought content normal.    Results for orders placed or performed in visit on 07/25/21  WET PREP FOR TRICH, YEAST, CLUE   Specimen: Sterile Swab   Sterile Swab  Result Value Ref Range   Trichomonas Exam Negative Negative   Yeast Exam Negative Negative   Clue Cell Exam Negative Negative      Assessment & Plan:   Problem List Items Addressed This Visit       Other   LLQ abdominal pain - Primary    Ongoing for several months with recent imaging and labs reassuring.  Wet prep today negative.  Will repeat CBC, CMP, and check TSH.  Suspect some IBS present, has increased stressors recently with work.  Trial Bentyl 10 MG before meals and  at bedtime.  Recommend keeping food diary and monitor for trigger items.  Could consider Alpha Gal and Celiac testing if ongoing.  Plan for return in 4 weeks for follow-up.         Relevant Orders   WET PREP FOR TRICH,  YEAST, CLUE (Completed)   CBC with Differential/Platelet   Comprehensive metabolic panel   TSH     Follow up plan: Return in about 4 weeks (around 08/22/2021) for Abdominal Pain.

## 2021-07-26 LAB — COMPREHENSIVE METABOLIC PANEL
ALT: 24 IU/L (ref 0–32)
AST: 16 IU/L (ref 0–40)
Albumin/Globulin Ratio: 1.8 (ref 1.2–2.2)
Albumin: 4.2 g/dL (ref 3.9–5.0)
Alkaline Phosphatase: 48 IU/L (ref 44–121)
BUN/Creatinine Ratio: 6 — ABNORMAL LOW (ref 9–23)
BUN: 5 mg/dL — ABNORMAL LOW (ref 6–20)
Bilirubin Total: 0.3 mg/dL (ref 0.0–1.2)
CO2: 22 mmol/L (ref 20–29)
Calcium: 9.6 mg/dL (ref 8.7–10.2)
Chloride: 103 mmol/L (ref 96–106)
Creatinine, Ser: 0.81 mg/dL (ref 0.57–1.00)
Globulin, Total: 2.4 g/dL (ref 1.5–4.5)
Glucose: 112 mg/dL — ABNORMAL HIGH (ref 70–99)
Potassium: 4.2 mmol/L (ref 3.5–5.2)
Sodium: 141 mmol/L (ref 134–144)
Total Protein: 6.6 g/dL (ref 6.0–8.5)
eGFR: 103 mL/min/{1.73_m2} (ref >59)

## 2021-07-26 LAB — CBC WITH DIFFERENTIAL/PLATELET
Basophils Absolute: 0 10*3/uL (ref 0.0–0.2)
Basos: 0 %
EOS (ABSOLUTE): 0.2 10*3/uL (ref 0.0–0.4)
Eos: 2 %
Hematocrit: 33.9 % — ABNORMAL LOW (ref 34.0–46.6)
Hemoglobin: 11.5 g/dL (ref 11.1–15.9)
Immature Grans (Abs): 0.1 10*3/uL (ref 0.0–0.1)
Immature Granulocytes: 1 %
Lymphocytes Absolute: 1.8 10*3/uL (ref 0.7–3.1)
Lymphs: 28 %
MCH: 28.6 pg (ref 26.6–33.0)
MCHC: 33.9 g/dL (ref 31.5–35.7)
MCV: 84 fL (ref 79–97)
Monocytes Absolute: 0.6 10*3/uL (ref 0.1–0.9)
Monocytes: 9 %
Neutrophils Absolute: 3.7 10*3/uL (ref 1.4–7.0)
Neutrophils: 60 %
Platelets: 346 10*3/uL (ref 150–450)
RBC: 4.02 x10E6/uL (ref 3.77–5.28)
RDW: 13.5 % (ref 11.7–15.4)
WBC: 6.3 10*3/uL (ref 3.4–10.8)

## 2021-07-26 LAB — TSH: TSH: 1.9 u[IU]/mL (ref 0.450–4.500)

## 2021-07-26 NOTE — Progress Notes (Signed)
Contacted via MyChart   Good evening Amanda Brown, your labs have returned: - CBC shows no anemia or infection. - CMP shows mild elevation in glucose -- if you just ate not long prior to labs this may explain that.  Kidney function, creatinine and eGFR, remains normal, as is liver function, AST and ALT.   - Thyroid lab, TSH, is normal.  Overall no specific findings to explain pain.  Let me know how Bentyl goes.  Any questions? Keep being stellar!!  Thank you for allowing me to participate in your care.  I appreciate you. Kindest regards, Marlowe Cinquemani

## 2021-08-07 NOTE — Telephone Encounter (Signed)
Error

## 2021-08-14 ENCOUNTER — Telehealth: Payer: Self-pay | Admitting: Nurse Practitioner

## 2021-08-14 NOTE — Telephone Encounter (Signed)
Copied from CRM [id number]. Topic: General - Other >> Aug 14, 2021  4:30 PM Everette C wrote: Reason for CRM: The patient's sister has called to follow up on previously submitted paperwork   The patient needs the paperwork completed to continue attending the day program that they're going to currently   Please contact further when possible

## 2021-08-15 NOTE — Telephone Encounter (Signed)
Paperwork was completed and picked up today on 08/15/21.

## 2021-08-29 ENCOUNTER — Ambulatory Visit: Payer: Commercial Managed Care - PPO | Admitting: Nurse Practitioner

## 2021-09-19 ENCOUNTER — Ambulatory Visit: Payer: Commercial Managed Care - PPO | Admitting: Nurse Practitioner

## 2022-01-14 DIAGNOSIS — E559 Vitamin D deficiency, unspecified: Secondary | ICD-10-CM | POA: Insufficient documentation

## 2022-01-14 NOTE — Patient Instructions (Incomplete)

## 2022-01-18 ENCOUNTER — Encounter: Payer: Commercial Managed Care - PPO | Admitting: Nurse Practitioner

## 2022-01-18 DIAGNOSIS — Z0000 Encounter for general adult medical examination without abnormal findings: Secondary | ICD-10-CM

## 2022-01-18 DIAGNOSIS — F418 Other specified anxiety disorders: Secondary | ICD-10-CM

## 2022-01-18 DIAGNOSIS — Z13220 Encounter for screening for lipoid disorders: Secondary | ICD-10-CM

## 2022-01-18 DIAGNOSIS — E559 Vitamin D deficiency, unspecified: Secondary | ICD-10-CM

## 2022-02-18 ENCOUNTER — Encounter: Payer: Self-pay | Admitting: Nurse Practitioner

## 2022-11-12 ENCOUNTER — Emergency Department
Admission: EM | Admit: 2022-11-12 | Discharge: 2022-11-12 | Disposition: A | Discharge status: Home / Self Care | Payer: Worker's Compensation | Attending: Student in an Organized Health Care Education/Training Program | Admitting: Student in an Organized Health Care Education/Training Program

## 2022-11-12 ENCOUNTER — Other Ambulatory Visit: Payer: Self-pay

## 2022-11-12 DIAGNOSIS — S71102A Unspecified open wound, left thigh, initial encounter: Secondary | ICD-10-CM | POA: Diagnosis present

## 2022-11-12 DIAGNOSIS — Z2914 Encounter for prophylactic rabies immune globin: Secondary | ICD-10-CM | POA: Insufficient documentation

## 2022-11-12 DIAGNOSIS — W540XXA Bitten by dog, initial encounter: Secondary | ICD-10-CM | POA: Insufficient documentation

## 2022-11-12 DIAGNOSIS — Z23 Encounter for immunization: Secondary | ICD-10-CM | POA: Insufficient documentation

## 2022-11-12 LAB — POC URINE PREG, ED: Preg Test, Ur: NEGATIVE

## 2022-11-12 MED ORDER — RABIES VACCINE, PCEC IM SUSR
1.0000 mL | Freq: Once | INTRAMUSCULAR | Status: AC
  Administered 2022-11-12: 1 mL via INTRAMUSCULAR
  Filled 2022-11-12: qty 1

## 2022-11-12 MED ORDER — RABIES IMMUNE GLOBULIN 150 UNIT/ML IM INJ
20.0000 [IU]/kg | INJECTION | Freq: Once | INTRAMUSCULAR | Status: AC
  Administered 2022-11-12: 1200 [IU] via INTRAMUSCULAR
  Filled 2022-11-12: qty 8

## 2022-11-12 NOTE — ED Provider Notes (Signed)
[facility] Provider Note    Event Date/Time   First MD Initiated Contact with Patient 11/12/22 1551     (approximate)   History   Animal Bite   HPI  Amanda Brown is a 27 y.o. 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  Was bit by aggressive pimple on her left leg.  Did break the skin.  Was already given prescription for Augmentin and wound care at [facility].     Physical Exam   Triage Vital Signs: ED Triage Vitals  Encounter Vitals Group     BP 11/12/22 1420 (!) 163/107     Systolic BP Percentile --      Diastolic BP Percentile --      Pulse Rate 11/12/22 1420 60     Resp 11/12/22 1420 18     Temp 11/12/22 1420 97.9 F (36.6 C)     Temp Source 11/12/22 1420 Oral     SpO2 11/12/22 1420 98 %     Weight 11/12/22 1617 131 lb (59.4 kg)     Height 11/12/22 1617 5\' 3"  (1.6 m)     Head Circumference --      Peak Flow --      Pain Score 11/12/22 1420 7     Pain Loc --      Pain Education --      Exclude from Growth Chart --     Most recent vital signs: Vitals:   11/12/22 1420  BP: (!) 163/107  Pulse: 60  Resp: 18  Temp: 97.9 F (36.6 C)  SpO2: 98%     Constitutional: Alert  Eyes: Conjunctivae are normal.  Head: Atraumatic. Nose: No congestion/rhinnorhea. Mouth/Throat: Mucous membranes are moist.   Neck: Painless ROM.  Cardiovascular:   Good peripheral circulation. Respiratory: Normal respiratory effort.  No retractions.  Gastrointestinal: Soft and nontender.  Musculoskeletal:  no deformity Neurologic:  MAE spontaneously. No gross focal neurologic deficits are appreciated.  Skin:  Skin is warm, dry.  Less than 1 cm wound to the left anterior thigh.  No foreign body.  No abscess no drainage. Psychiatric: Mood and affect are normal. Speech and behavior are normal.    ED Results / Procedures / Treatments   Labs (all labs ordered are  listed, but only abnormal results are displayed) Labs Reviewed  POC URINE PREG, ED     EKG     RADIOLOGY    PROCEDURES:  Critical Care performed:   Procedures   MEDICATIONS ORDERED IN ED: Medications  rabies immune globulin (HYPERRAB/KEDRAB) injection 1,200 Units (has no administration in time range)  rabies vaccine (RABAVERT) injection 1 mL (has no administration in time range)     IMPRESSION / MDM / ASSESSMENT AND PLAN / ED COURSE  I reviewed the triage vital signs and the nursing notes.                              Differential diagnosis includes, but is not limited to, laceration, abrasion, cellulitis, rabies exposure  Patient presented the ER from urgent care for rabies series.  Think that is reasonable given history and exposure.  Discussed outpatient follow-up.  She already has prescription for antibiotics.  Discussed signs and symptoms for which she should return to the ER.       FINAL CLINICAL IMPRESSION(S) /  ED DIAGNOSES   Final diagnoses:  Dog bite, initial encounter  Need for rabies vaccination     Rx / DC Orders   ED Discharge Orders     None        Note:  This document was prepared using Dragon voice recognition software and may include unintentional dictation errors.    Willy Eddy, MD 11/12/22 [phone]

## 2022-11-12 NOTE — ED Triage Notes (Signed)
Pt to ED via POV from work. 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 Pt works for [employer]. This will be WC.

## 2022-11-14 ENCOUNTER — Emergency Department
Admission: EM | Admit: 2022-11-14 | Discharge: 2022-11-14 | Discharge status: Against Medical Advice | Payer: Worker's Compensation | Attending: Emergency Medicine | Admitting: Emergency Medicine

## 2022-11-14 ENCOUNTER — Other Ambulatory Visit: Payer: Self-pay

## 2022-11-14 DIAGNOSIS — Z5321 Procedure and treatment not carried out due to patient leaving prior to being seen by health care provider: Secondary | ICD-10-CM | POA: Insufficient documentation

## 2022-11-14 DIAGNOSIS — Z23 Encounter for immunization: Secondary | ICD-10-CM | POA: Diagnosis present

## 2022-11-14 NOTE — ED Triage Notes (Signed)
Pt to ED for rabies vaccine. States was sent to [facility] and another referral by ER for vaccine but was told they dont do rabies vaccines.

## 2022-11-15 ENCOUNTER — Other Ambulatory Visit: Payer: Self-pay

## 2022-11-15 ENCOUNTER — Emergency Department
Admission: EM | Admit: 2022-11-15 | Discharge: 2022-11-15 | Disposition: A | Discharge status: Home / Self Care | Payer: Self-pay | Attending: Student in an Organized Health Care Education/Training Program | Admitting: Student in an Organized Health Care Education/Training Program

## 2022-11-15 ENCOUNTER — Encounter: Payer: Self-pay | Admitting: Emergency Medicine

## 2022-11-15 DIAGNOSIS — Z203 Contact with and (suspected) exposure to rabies: Secondary | ICD-10-CM

## 2022-11-15 DIAGNOSIS — Z2914 Encounter for prophylactic rabies immune globin: Secondary | ICD-10-CM | POA: Insufficient documentation

## 2022-11-15 DIAGNOSIS — Z23 Encounter for immunization: Secondary | ICD-10-CM | POA: Insufficient documentation

## 2022-11-15 MED ORDER — RABIES VACCINE, PCEC IM SUSR
1.0000 mL | Freq: Once | INTRAMUSCULAR | Status: DC
Start: 2022-11-15 — End: 2022-11-15

## 2022-11-15 MED ORDER — RABIES VACCINE, PCEC IM SUSR
1.0000 mL | Freq: Once | INTRAMUSCULAR | Status: AC
  Administered 2022-11-15: 1 mL via INTRAMUSCULAR
  Filled 2022-11-15: qty 1

## 2022-11-15 NOTE — ED Provider Notes (Signed)
   [facility] Provider Note    Event Date/Time   First MD Initiated Contact with Patient 11/15/22 1152     (approximate)   History   rabies series   HPI  Amanda Brown is a 27 y.o. female is here to get her second shot of the rabies series.  This was initiated on 9/9.  She reports no symptoms or reaction from the first shot.     Physical Exam   Triage Vital Signs: ED Triage Vitals [11/15/22 1121]  Encounter Vitals Group     BP      Systolic BP Percentile      Diastolic BP Percentile      Pulse      Resp      Temp      Temp src      SpO2      Weight 130 lb (59 kg)     Height 5\' 3"  (1.6 m)     Head Circumference      Peak Flow      Pain Score 0     Pain Loc      Pain Education      Exclude from Growth Chart     Most recent vital signs: Vitals:   11/15/22 1156  BP: (!) 148/98  Pulse: 70  Resp: 18  Temp: 98 F (36.7 C)  SpO2: 100%     General: Awake, no distress.  CV:  Good peripheral perfusion. Resp:  Normal effort.  Abd:  No distention.    ED Results / Procedures / Treatments   Labs (all labs ordered are listed, but only abnormal results are displayed) Labs Reviewed - No data to display   PROCEDURES:  Critical Care performed: No  Procedures   MEDICATIONS ORDERED IN ED: Medications  rabies vaccine (RABAVERT) injection 1 mL (has no administration in time range)  rabies vaccine (RABAVERT) injection 1 mL (1 mL Intramuscular Given 11/15/22 1203)     IMPRESSION / MDM / ASSESSMENT AND PLAN / ED COURSE  I reviewed the triage vital signs and the nursing notes.                             27 year old female presents to get her second rabies vaccine.  Differential diagnosis includes, but is not limited to, rabies series.  Patient's presentation is most consistent with acute, uncomplicated illness.  Patient received her second rabies vaccine.  She is aware that she will need to get 2 more and the dates to get them on.   All questions were answered and she was stable at discharge.   FINAL CLINICAL IMPRESSION(S) / ED DIAGNOSES   Final diagnoses:  Need for post exposure prophylaxis for rabies     Rx / DC Orders   ED Discharge Orders     None        Note:  This document was prepared using Dragon voice recognition software and may include unintentional dictation errors.   Cameron Ali, PA-C 11/15/22 1218    Willy Eddy, MD 11/15/22 1428

## 2022-11-15 NOTE — Discharge Instructions (Signed)
You received your second rabies vaccine today.  Will need to have 2 more vaccines. Your 3rd shot is to be done on 9/16 and your 4th and final shot is to be done on 9/23.

## 2022-11-15 NOTE — ED Triage Notes (Signed)
Pt states that she is here for her 3rd rabies shot

## 2022-11-19 ENCOUNTER — Emergency Department
Admission: EM | Admit: 2022-11-19 | Discharge: 2022-11-19 | Disposition: A | Discharge status: Home / Self Care | Payer: Worker's Compensation | Attending: Emergency Medicine | Admitting: Emergency Medicine

## 2022-11-19 ENCOUNTER — Encounter: Payer: Self-pay | Admitting: *Deleted

## 2022-11-19 ENCOUNTER — Other Ambulatory Visit: Payer: Self-pay

## 2022-11-19 DIAGNOSIS — Z203 Contact with and (suspected) exposure to rabies: Secondary | ICD-10-CM | POA: Insufficient documentation

## 2022-11-19 DIAGNOSIS — Z23 Encounter for immunization: Secondary | ICD-10-CM | POA: Insufficient documentation

## 2022-11-19 MED ORDER — RABIES VACCINE, PCEC IM SUSR
1.0000 mL | Freq: Once | INTRAMUSCULAR | Status: AC
  Administered 2022-11-19: 1 mL via INTRAMUSCULAR
  Filled 2022-11-19: qty 1

## 2022-11-19 NOTE — ED Provider Notes (Signed)
   [facility] Provider Note    Event Date/Time   First MD Initiated Contact with Patient 11/19/22 1134     (approximate)   History   Rabies Injection   HPI  Amanda Brown is a 27 y.o. female presents for her third shot in the rabies vaccine series.  Patient has no other complaints.     Physical Exam   Triage Vital Signs: ED Triage Vitals  Encounter Vitals Group     BP 11/19/22 1122 (!) 126/96     Systolic BP Percentile --      Diastolic BP Percentile --      Pulse Rate 11/19/22 1122 72     Resp 11/19/22 1122 16     Temp 11/19/22 1122 98.7 F (37.1 C)     Temp Source 11/19/22 1122 Oral     SpO2 11/19/22 1122 100 %     Weight 11/19/22 1134 129 lb 13.6 oz (58.9 kg)     Height 11/19/22 1134 5\' 3"  (1.6 m)     Head Circumference --      Peak Flow --      Pain Score 11/19/22 1127 7     Pain Loc --      Pain Education --      Exclude from Growth Chart --     Most recent vital signs: Vitals:   11/19/22 1122  BP: (!) 126/96  Pulse: 72  Resp: 16  Temp: 98.7 F (37.1 C)  SpO2: 100%    General: Awake, no distress.  CV:  Good peripheral perfusion.  Resp:  Normal effort.  Abd:  No distention.     ED Results / Procedures / Treatments   Labs (all labs ordered are listed, but only abnormal results are displayed) Labs Reviewed - No data to display   PROCEDURES:  Critical Care performed: No  Procedures   MEDICATIONS ORDERED IN ED: Medications  rabies vaccine (RABAVERT) injection 1 mL (1 mL Intramuscular Given 11/19/22 1140)     IMPRESSION / MDM / ASSESSMENT AND PLAN / ED COURSE  I reviewed the triage vital signs and the nursing notes.                              Differential diagnosis includes, but is not limited to, rabies vaccination.  Patient's presentation is most consistent with acute, uncomplicated illness.  Patient received her third shot in the rabies series.  Patient is aware that she will need to have her fourth in  1 week.  She voiced understanding, all questions were answered and she was stable at discharge.   FINAL CLINICAL IMPRESSION(S) / ED DIAGNOSES   Final diagnoses:  Need for rabies vaccination     Rx / DC Orders   ED Discharge Orders     None        Note:  This document was prepared using Dragon voice recognition software and may include unintentional dictation errors.   Cameron Ali, PA-C 11/19/22 1142    Jene Every, MD 11/19/22 1453

## 2022-11-19 NOTE — ED Triage Notes (Signed)
Pt is here for her third rabies vaccine.  Bite on left knee healing

## 2022-11-19 NOTE — Discharge Instructions (Signed)
This was your third shot in the vaccine series.  You will need to have your fourth in 1 week.

## 2022-11-26 ENCOUNTER — Emergency Department
Admission: EM | Admit: 2022-11-26 | Discharge: 2022-11-26 | Disposition: A | Discharge status: Home / Self Care | Payer: Worker's Compensation | Attending: Emergency Medicine | Admitting: Emergency Medicine

## 2022-11-26 DIAGNOSIS — Z23 Encounter for immunization: Secondary | ICD-10-CM | POA: Diagnosis present

## 2022-11-26 DIAGNOSIS — Z203 Contact with and (suspected) exposure to rabies: Secondary | ICD-10-CM | POA: Insufficient documentation

## 2022-11-26 DIAGNOSIS — W540XXD Bitten by dog, subsequent encounter: Secondary | ICD-10-CM | POA: Insufficient documentation

## 2022-11-26 MED ORDER — RABIES VACCINE, PCEC IM SUSR
1.0000 mL | Freq: Once | INTRAMUSCULAR | Status: AC
  Administered 2022-11-26: 1 mL via INTRAMUSCULAR
  Filled 2022-11-26: qty 1

## 2022-11-26 NOTE — Discharge Instructions (Addendum)
Please follow-up for any new, worsening, or change in symptoms or other concerns.  It was a pleasure caring for you today.

## 2022-11-26 NOTE — ED Triage Notes (Signed)
Pt here for fourth rabies vaccine. Bite on left knee, slightly swollen. No redness

## 2022-11-26 NOTE — ED Provider Notes (Signed)
[facility] Provider Note    Event Date/Time   First MD Initiated Contact with Patient 11/26/22 1231     (approximate)   History   Rabies Injection   HPI  Daphnie Katcher is a 27 y.o. female who presents today with request for rabies shot.  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  She reports that the wound is healing well.  She is here for her fourth and final rabies shot.  She reports that she has completed the antibiotics and has had no problems with bite.  No fevers or chills.  Patient Active Problem List   Diagnosis Date Noted   Vitamin D deficiency 01/14/2022   Neck pain 06/30/2021   Depression with anxiety 06/30/2021   Family history of exposure to Agent [facility] 01/18/2021   Palpitations with regular cardiac rhythm 01/18/2021   Abnormal leg movement 01/18/2021   Cubital tunnel syndrome on left 01/18/2021          Physical Exam   Triage Vital Signs: ED Triage Vitals  Encounter Vitals Group     BP 11/26/22 1149 (!) 127/100     Systolic BP Percentile --      Diastolic BP Percentile --      Pulse Rate 11/26/22 1148 85     Resp 11/26/22 1148 16     Temp 11/26/22 1148 98.9 F (37.2 C)     Temp src --      SpO2 11/26/22 1148 100 %     Weight --      Height --      Head Circumference --      Peak Flow --      Pain Score 11/26/22 1148 0     Pain Loc --      Pain Education --      Exclude from Growth Chart --     Most recent vital signs: Vitals:   11/26/22 1148 11/26/22 1149  BP:  (!) 127/100  Pulse: 85   Resp: 16   Temp: 98.9 F (37.2 C)   SpO2: 100%     Physical Exam Vitals and nursing note reviewed.  Constitutional:      General: Awake and alert. No acute distress.    Appearance: Normal appearance. The patient is normal weight.  HENT:     Head: Normocephalic and atraumatic.     Mouth: Mucous membranes are moist.  Eyes:     General: PERRL. Normal EOMs        Right  eye: No discharge.        Left eye: No discharge.     Conjunctiva/sclera: Conjunctivae normal.  Cardiovascular:     Rate and Rhythm: Normal rate and regular rhythm.     Pulses: Normal pulses.  Pulmonary:     Effort: Pulmonary effort is normal. No respiratory distress.     Breath sounds: Normal breath sounds.  Abdominal:     Abdomen is soft. There is no abdominal tenderness. No rebound or guarding. No distention. Musculoskeletal:        General: No swelling. Normal range of motion.     Cervical back: Normal range of motion and neck supple.  Scabbed lesion to left inferior lateral portion knee.  No surrounding erythema.  Full and normal range of motion of her knee.  No warmth.  No drainage. Skin:    General: Skin is warm and dry.     Capillary  Refill: Capillary refill takes less than 2 seconds.     Findings: No rash.  Neurological:     Mental Status: The patient is awake and alert.      ED Results / Procedures / Treatments   Labs (all labs ordered are listed, but only abnormal results are displayed) Labs Reviewed - No data to display   EKG     RADIOLOGY     PROCEDURES:  Critical Care performed:   Procedures   MEDICATIONS ORDERED IN ED: Medications  rabies vaccine (RABAVERT) injection 1 mL (1 mL Intramuscular Given 11/26/22 1245)     IMPRESSION / MDM / ASSESSMENT AND PLAN / ED COURSE  I reviewed the triage vital signs and the nursing notes.   Differential diagnosis includes, but is not limited to, need for rabies vaccination, dog bite, infection.  I reviewed the patient's chart.  Patient has been seen multiple times for her rabies vaccination series and has acquired them in the appropriate days.  Her wound appears to be healing well.  There is no surrounding erythema or warmth.  It appears to be healed.  She has full and normal range of motion of her knee.  She was given her final rabies vaccination without incident.  She was discharged in stable condition.   She understands return precautions as needed.   Patient's presentation is most consistent with acute, uncomplicated illness.   FINAL CLINICAL IMPRESSION(S) / ED DIAGNOSES   Final diagnoses:  Need for post exposure prophylaxis for rabies  Dog bite, subsequent encounter     Rx / DC Orders   ED Discharge Orders     None        Note:  This document was prepared using Dragon voice recognition software and may include unintentional dictation errors.   Jackelyn Hoehn, PA-C 11/26/22 1438    Trinna Post, MD 11/27/22 [phone]

## 2023-07-05 ENCOUNTER — Ambulatory Visit: Payer: Self-pay

## 2023-07-05 NOTE — Telephone Encounter (Signed)
 Chief Complaint: Abscess on leg Symptoms: pain Frequency: Since Saturday Pertinent Negatives: Patient denies fever, CP Disposition: [] ED /[x] Urgent Care (no appt availability in office) / [] Appointment(In office/virtual)/ []  [facility]/ [] Home Care/ [] Refused Recommended Disposition /[] [facility]/ []  Follow-up with PCP Additional Notes: Pt reports she noted discomfort to her upper legs below her buttocks on Saturday, she reports yesterday she discovered what appears to be an abscess to the back of both legs. Pt reports pain at 8/10, notes it is constant burning pain. Notes migraine/nausea earlier in the week that has since resolved. Denies fever, rash, redness, SOB, CP. OV scheduled per PAS prior to triage, advised pt to be seen today, no OV available, pt referred to UC but declines. This RN educated pt on delayed care, continues to decline and request note sent to provider. Pt inquired about lancing at home with a safety pin, this RN advised pt eval/treat by provider is appropriate and encouraged pt not to attempt lancing abscess at home. This RN educated pt on home care, new-worsening symptoms, when to call back/seek emergent care. Pt verbalized understanding and agrees to plan.    Copied from CRM [id number]. Topic: Clinical - Red Word Triage >> Jul 05, 2023 11:43 AM Baldemar Lev wrote: Red Word that prompted transfer to Nurse Triage: Severe pain, abscess on back leg. Reason for Disposition  SEVERE pain (e.g., excruciating)  Answer Assessment - Initial Assessment Questions 2. LOCATION: "Where is the boil located?"      Back of both legs, left side is larger 3. NUMBER: "How many boils are there?"      2, 1 on each leg, below buttocks 4. SIZE: "How big is the boil?" (e.g., inches, cm; compare to size of a coin or other object)     Left, ping pong ball, right is localized swelling 5. ONSET: "When did the boil start?"     Saturday noticed pain, yesterday noted  abscess/boil 6. PAIN: "Is there any pain?" If Yes, ask: "How bad is the pain?"   (Scale 1-10; or mild, moderate, severe)     8/10 7. FEVER: "Do you have a fever?" If Yes, ask: "What is it, how was it measured, and when did it start?"      None 9. OTHER SYMPTOMS: "Do you have any other symptoms?" (e.g., shaking chills, weakness, rash elsewhere on body)     nausea  Protocols used: Boil (Skin Abscess)-A-AH

## 2023-07-05 NOTE — Telephone Encounter (Signed)
 Patient is scheduled for 07/10/2023. Offered sooner appointment but due to work unable to make that appointment.

## 2023-07-09 ENCOUNTER — Encounter: Payer: Self-pay | Admitting: Nurse Practitioner

## 2023-07-09 ENCOUNTER — Ambulatory Visit: Payer: Self-pay | Admitting: Nurse Practitioner

## 2023-07-09 VITALS — BP 147/109 | HR 75 | Temp 97.8°F | Ht 63.0 in | Wt 132.8 lb

## 2023-07-09 DIAGNOSIS — L0231 Cutaneous abscess of buttock: Secondary | ICD-10-CM | POA: Diagnosis not present

## 2023-07-09 DIAGNOSIS — L0291 Cutaneous abscess, unspecified: Secondary | ICD-10-CM | POA: Insufficient documentation

## 2023-07-09 DIAGNOSIS — Z599 Problem related to housing and economic circumstances, unspecified: Secondary | ICD-10-CM

## 2023-07-09 DIAGNOSIS — I10 Essential (primary) hypertension: Secondary | ICD-10-CM | POA: Insufficient documentation

## 2023-07-09 MED ORDER — ONDANSETRON HCL 4 MG PO TABS: 4.0000 mg | ORAL_TABLET | Freq: Three times a day (TID) | ORAL | 0 refills | Status: AC | PRN

## 2023-07-09 MED ORDER — IBUPROFEN 800 MG PO TABS: 800.0000 mg | ORAL_TABLET | Freq: Three times a day (TID) | ORAL | 4 refills | Status: AC | PRN

## 2023-07-09 MED ORDER — LOSARTAN POTASSIUM 25 MG PO TABS: 25.0000 mg | ORAL_TABLET | Freq: Every day | ORAL | 1 refills | Status: AC

## 2023-07-09 MED ORDER — SULFAMETHOXAZOLE-TRIMETHOPRIM 800-160 MG PO TABS: 1.0000 | ORAL_TABLET | Freq: Two times a day (BID) | ORAL | 0 refills | Status: AC

## 2023-07-09 NOTE — Assessment & Plan Note (Signed)
 Ongoing since September on review.  At this time have recommended starting a low dose of ARB to see if benefit and to get BP to goal <130/80, especially since her mother had HTN and history of CVA.  She is agreeable to this plan.  Will start Losartan 25 MG daily, educated her on this medication and side effects to monitor for.  Recommend she monitor BP at least a few mornings a week at home and document.  DASH diet at home.  Labs today: obtain next visit to check on K+.  Due to cost will hold labs today.

## 2023-07-09 NOTE — Assessment & Plan Note (Addendum)
 Bilateral buttocks, now healing.  Will send in Bactrim BID for 7 days and refills on Zofran  and Ibuprofen .  Recommend minimal Ibuprofen  use due to BP elevations, educated her on this.  Tylenol  can be used. Letter provided for work.

## 2023-07-09 NOTE — Patient Instructions (Signed)

## 2023-07-09 NOTE — Progress Notes (Signed)
 BP (!) 147/109   Pulse 75   Temp 97.8 F (36.6 C) (Oral)   Ht 5\' 3"  (1.6 m)   Wt 132 lb 12.8 oz (60.2 kg)   SpO2 93%   BMI 23.52 kg/m    Subjective:    Patient ID: Amanda Brown, female    DOB: 1995-12-29, 28 y.o.   MRN: [id number]  HPI: Amanda Brown is a 28 y.o. female  Chief Complaint  Patient presents with   Leg Pain    Patient states she started pain in both of her legs about 2 weeks ago. States the pains are at the bottom of her buttock on both sides. States the area burst and has been bleeding a lot. States she has been feeling nauseous and has been taking advil  to try to help with the pain.    Hypertension   HYPERTENSION without Chronic Kidney Disease Has had elevations in BP since September, was seen in ER for other issues at the time. Is always under stress at baseline. Mother had high blood pressure. Does not eat higher salt at home.  Has been taking more Motrin  recently due to pain with abscess. Does drink lots of water daily.  Her mother took ACE and this made her cough. Hypertension status: uncontrolled  Duration of hypertension: new onset BP monitoring frequency:  not checking BP range:  BP medication side effects:  no Recurrent headaches: had migraine last week Visual changes: no Palpitations: no Dyspnea: no Chest pain: no Lower extremity edema: no Dizzy/lightheaded: occasional  SKIN INFECTION Pain started on April 26th, she thought it would go away on its own.  Missed work last week with this.  Has been nauseated + having muscle aches. No history of the same.  The abscess did open on May 3rd with bloody drainage.  Has similar to right side present. Duration: days Location: left buttock History of trauma in area: no Pain: yes Quality: yes Severity: 9/10 Redness: yes Swelling: yes Oozing: yes Pus: no Fevers:  possibly Nausea/vomiting: yes Status: fluctuating Treatments attempted: ibuprofen    Tetanus: UTD   Relevant past medical, surgical, family and  social history reviewed and updated as indicated. Interim medical history since our last visit reviewed. Allergies and medications reviewed and updated.  Review of Systems  Constitutional:  Negative for activity change, appetite change, diaphoresis, fatigue and fever.  Respiratory:  Negative for cough, chest tightness and shortness of breath.   Cardiovascular:  Negative for chest pain, palpitations and leg swelling.  Gastrointestinal: Negative.   Skin:  Positive for wound.  Neurological:  Positive for headaches (x one recently). Negative for dizziness, syncope, weakness, light-headedness and numbness.  Psychiatric/Behavioral: Negative.      Per HPI unless specifically indicated above     Objective:    BP (!) 147/109   Pulse 75   Temp 97.8 F (36.6 C) (Oral)   Ht 5\' 3"  (1.6 m)   Wt 132 lb 12.8 oz (60.2 kg)   SpO2 93%   BMI 23.52 kg/m   Wt Readings from Last 3 Encounters:  07/09/23 132 lb 12.8 oz (60.2 kg)  11/19/22 129 lb 13.6 oz (58.9 kg)  11/15/22 130 lb (59 kg)    Physical Exam Vitals and nursing note reviewed.  Constitutional:      General: She is awake. She is not in acute distress.    Appearance: She is well-developed and well-groomed. She is not ill-appearing or toxic-appearing.  HENT:     Head: Normocephalic.  Right Ear: Hearing and external ear normal.     Left Ear: Hearing and external ear normal.  Eyes:     General: Lids are normal.        Right eye: No discharge.        Left eye: No discharge.     Conjunctiva/sclera: Conjunctivae normal.     Pupils: Pupils are equal, round, and reactive to light.  Neck:     Thyroid: No thyromegaly.     Vascular: No carotid bruit.  Cardiovascular:     Rate and Rhythm: Normal rate and regular rhythm.     Heart sounds: Normal heart sounds. No murmur heard.    No gallop.  Pulmonary:     Effort: Pulmonary effort is normal. No accessory muscle usage or respiratory distress.     Breath sounds: Normal breath sounds.   Abdominal:     General: Bowel sounds are normal. There is no distension.     Palpations: Abdomen is soft.     Tenderness: There is no abdominal tenderness.  Musculoskeletal:     Cervical back: Normal range of motion and neck supple.     Right lower leg: No edema.     Left lower leg: No edema.  Lymphadenopathy:     Cervical: No cervical adenopathy.  Skin:    General: Skin is warm and dry.     Findings: Abscess present.     Comments: One abscess now healed to middle of left gluteus.  No drainage present.  Mild tenderness. To right gluteus similar finding.  Neurological:     Mental Status: She is alert and oriented to person, place, and time.     Cranial Nerves: Cranial nerves 2-12 are intact.     Deep Tendon Reflexes: Reflexes are normal and symmetric.     Reflex Scores:      Brachioradialis reflexes are 2+ on the right side and 2+ on the left side.      Patellar reflexes are 2+ on the right side and 2+ on the left side. Psychiatric:        Attention and Perception: Attention normal.        Mood and Affect: Mood normal.        Speech: Speech normal.        Behavior: Behavior normal. Behavior is cooperative.        Thought Content: Thought content normal.     Results for orders placed or performed during the hospital encounter of 11/12/22  POC urine preg, ED   Collection Time: 11/12/22  4:49 PM  Result Value Ref Range   Preg Test, Ur NEGATIVE NEGATIVE      Assessment & Plan:   Problem List Items Addressed This Visit       Cardiovascular and Mediastinum   Essential hypertension - Primary   Ongoing since September on review.  At this time have recommended starting a low dose of ARB to see if benefit and to get BP to goal <130/80, especially since her mother had HTN and history of CVA.  She is agreeable to this plan.  Will start Losartan 25 MG daily, educated her on this medication and side effects to monitor for.  Recommend she monitor BP at least a few mornings a week at  home and document.  DASH diet at home.  Labs today: obtain next visit to check on K+.  Due to cost will hold labs today.      Relevant Medications   losartan (COZAAR) 25  MG tablet     Musculoskeletal and Integument   Abscess of skin   Bilateral buttocks, now healing.  Will send in Bactrim BID for 7 days and refills on Zofran  and Ibuprofen .  Recommend minimal Ibuprofen  use due to BP elevations, educated her on this.  Tylenol  can be used. Letter provided for work.        Follow up plan: Return in 4 weeks (on 08/06/2023) for HTN -- started Losartan on 07/09/23.

## 2023-07-10 ENCOUNTER — Ambulatory Visit: Payer: Self-pay | Admitting: Pediatrics

## 2023-07-12 ENCOUNTER — Telehealth: Payer: Self-pay

## 2023-07-12 NOTE — Progress Notes (Unsigned)
 Complex Care Management Note Care Guide Note  07/12/2023 Name: Amanda Brown MRN: [id number] DOB: 1996-01-16   Complex Care Management Outreach Attempts: An unsuccessful telephone outreach was attempted today to offer the patient information about available complex care management services.  Follow Up Plan:  Additional outreach attempts will be made to offer the patient complex care management information and services.   Encounter Outcome:  No Answer  Lenton Rail , RMA     [place]  [facility] Guide  Direct Dial: [phone]  Website: [email or web address]  \

## 2023-07-13 IMAGING — CT CT ABD-PELV W/O CM
2 of 4 series · 16 of 46 positions shown, 18 images · non-contrast
Comparison: None Available.

CLINICAL DATA: Left lower quadrant pain beginning last night. Some
nausea.



[Series 3: ap without · axial · non-contrast · 0.68mm/px · z∈[+1066,+1466]mm · 13 of 90 slices shown, 15 images]
[im 5/90  soft-tissue]
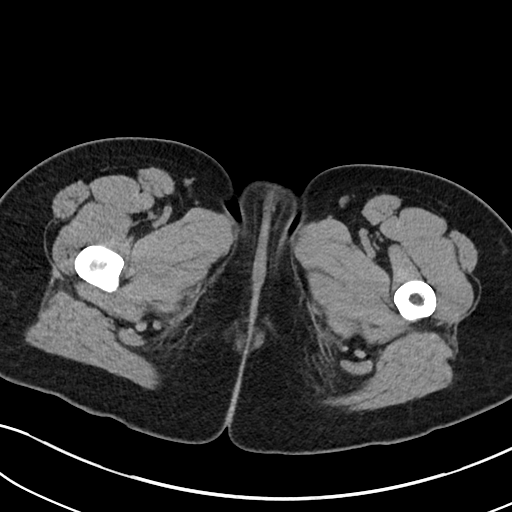
[im 5/90  bone]
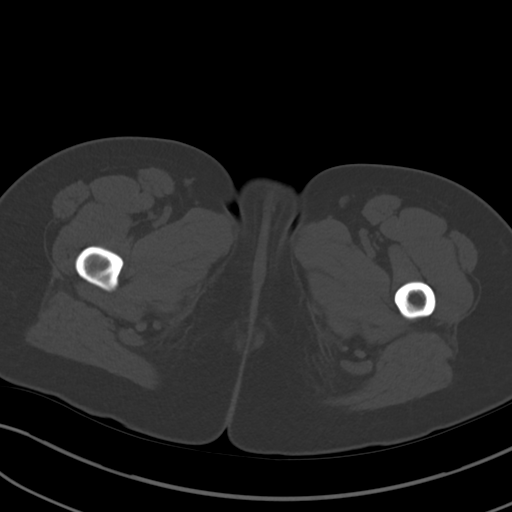
[im 14/90  soft-tissue]
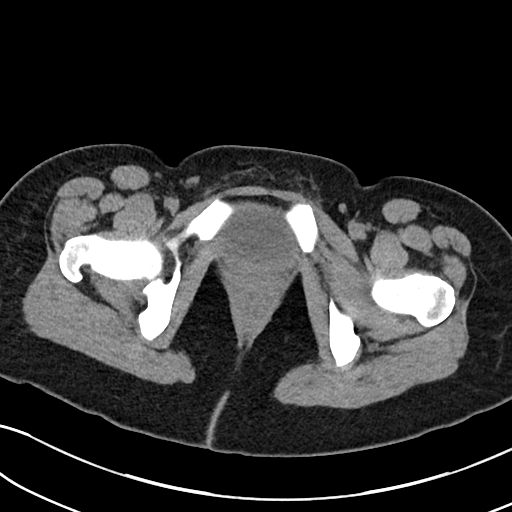
[im 18/90  soft-tissue]
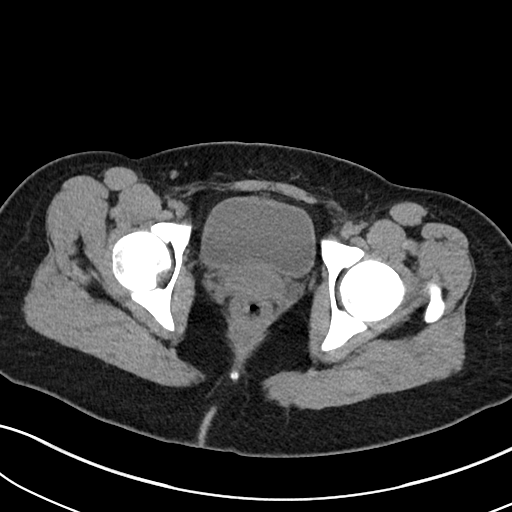
[im 27/90  soft-tissue]
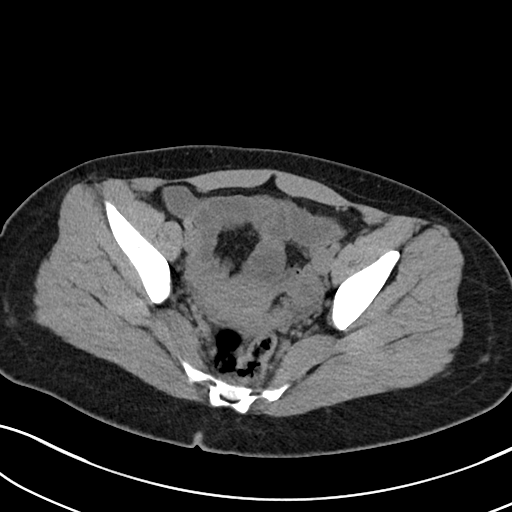
[im 32/90  soft-tissue]
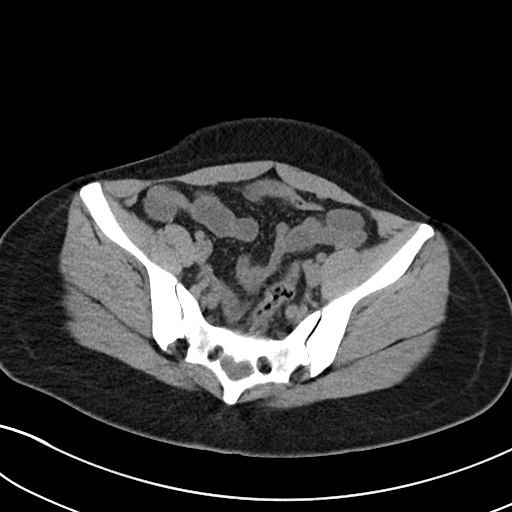
[im 41/90  soft-tissue]
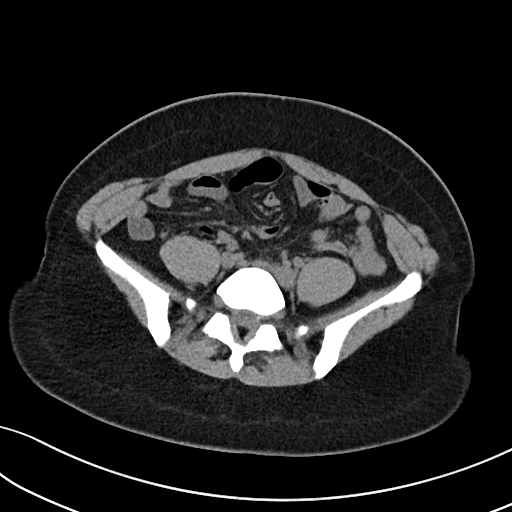
[im 45/90  soft-tissue]
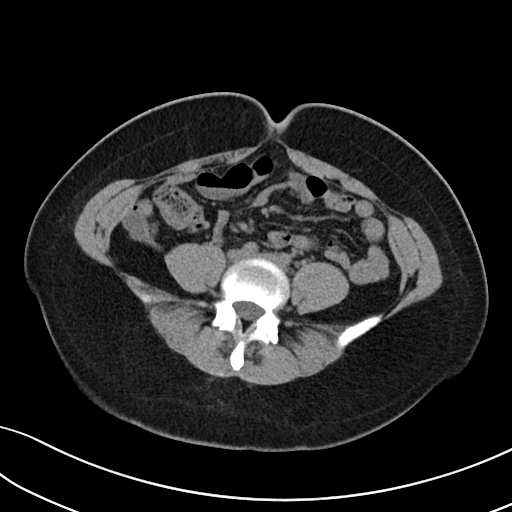
[im 49/90  soft-tissue]
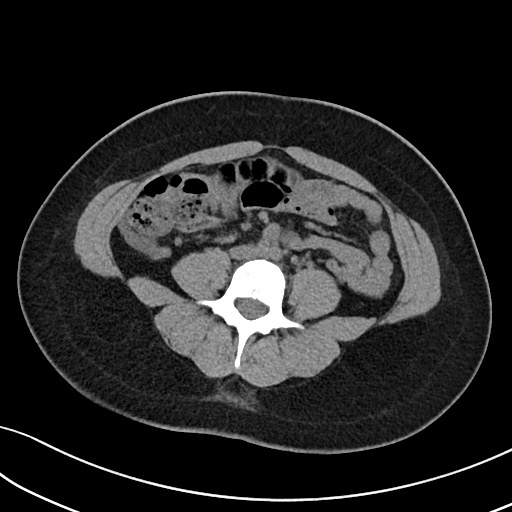
[im 58/90  soft-tissue]
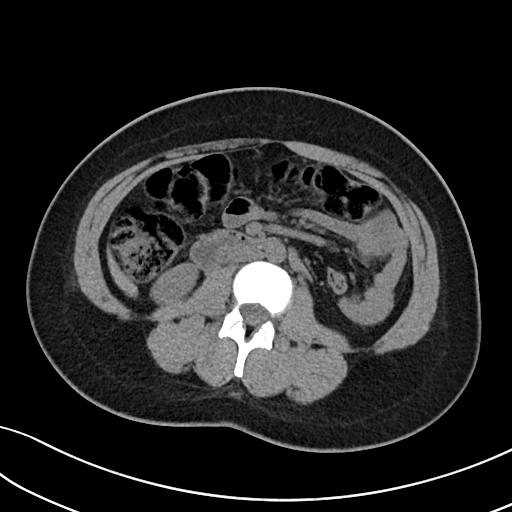
[im 58/90  bone]
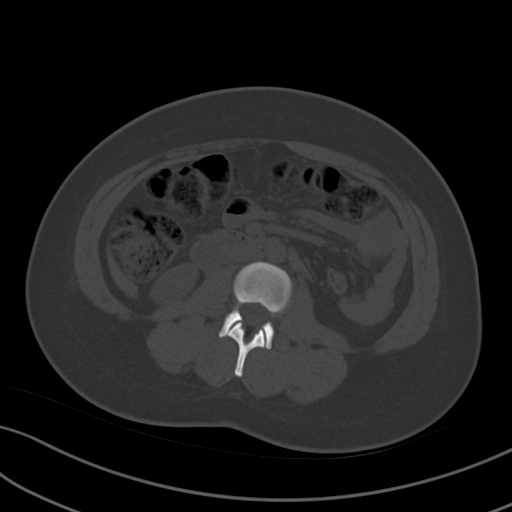
[im 63/90  soft-tissue]
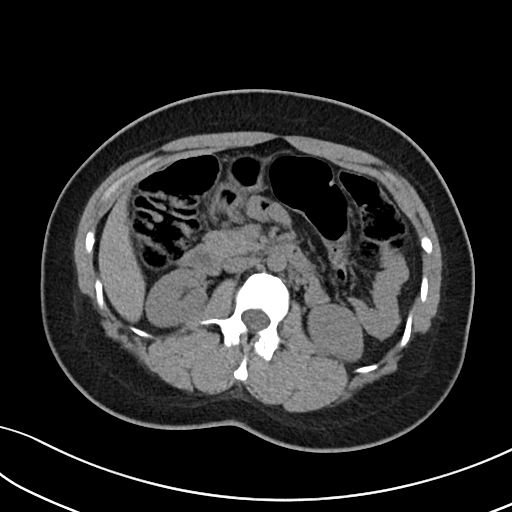
[im 72/90  soft-tissue]
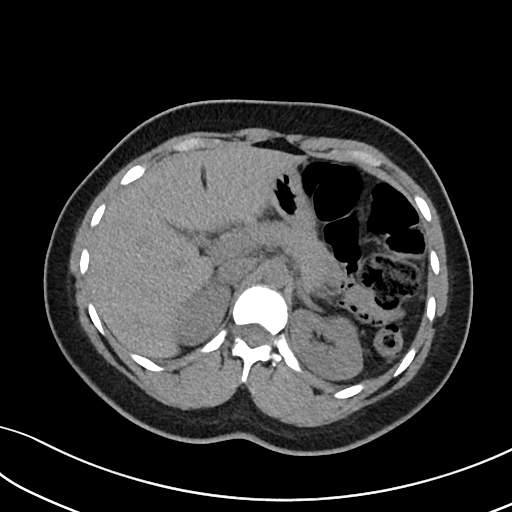
[im 76/90  soft-tissue]
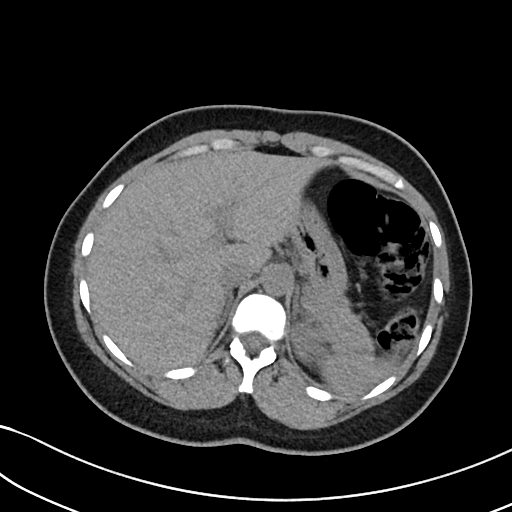
[im 85/90  soft-tissue]
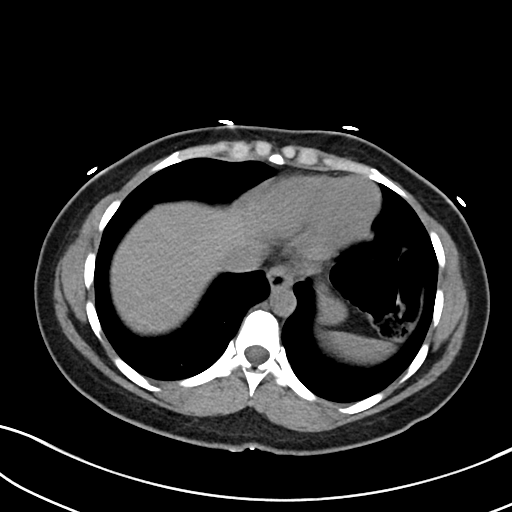

[Series 6: cor · coronal · 0.72mm/px · 3 of 86 slices shown]
[im 29/86  soft-tissue]
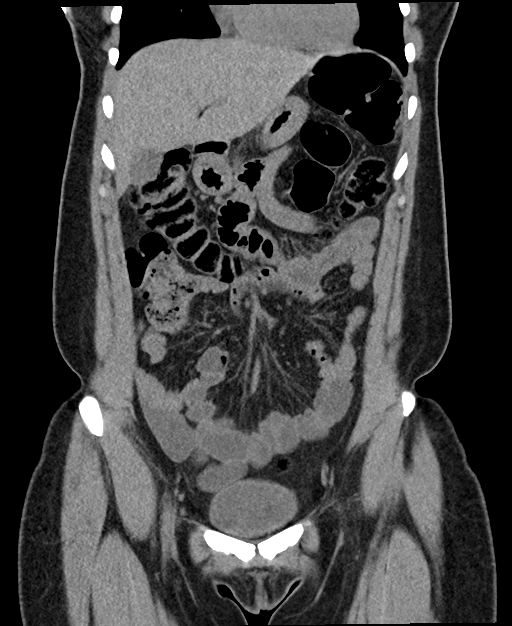
[im 38/86  soft-tissue]
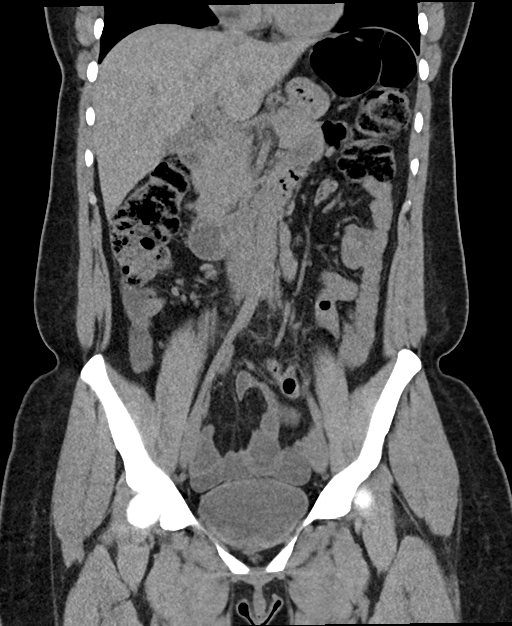
[im 48/86  soft-tissue]
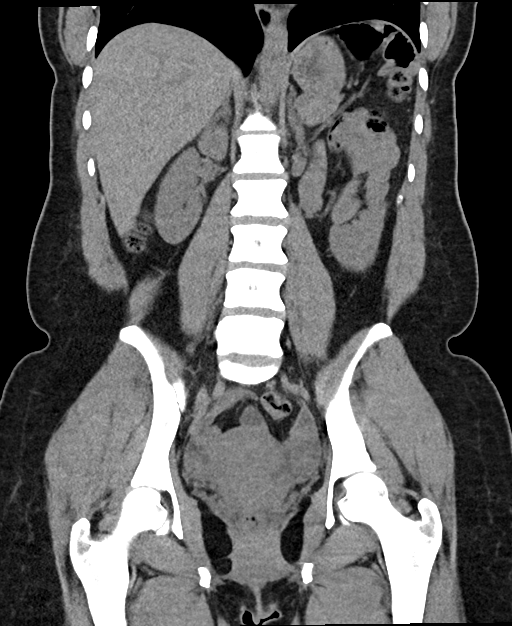

[16 of 46 positions shown; findings below may reference images not displayed]

FINDINGS: Lower chest: Lung bases are clear.

Hepatobiliary: Liver, gallbladder and biliary tree are normal.

Pancreas: Normal.

Spleen: Normal.

Adrenals/Urinary Tract: Adrenal glands are normal. Kidneys are
normal in size without hydronephrosis or nephrolithiasis. Ureters
and bladder are normal.

Stomach/Bowel: Stomach and small bowel are normal. Appendix is
normal. Colon is normal.

Vascular/Lymphatic: Abdominal aorta is normal in caliber. Remaining
vascular structures are unremarkable. There is no adenopathy.

Reproductive: Normal.

Other: No significant free fluid or focal inflammatory change.

Musculoskeletal: No focal abnormality.
IMPRESSION: No acute findings in the abdomen/pelvis.

## 2023-07-16 NOTE — Progress Notes (Unsigned)
 Complex Care Management Note Care Guide Note  07/16/2023 Name: Amanda Brown MRN: [id number] DOB: [birth year]   Complex Care Management Outreach Attempts: A second unsuccessful outreach was attempted today to offer the patient with information about available complex care management services.  Follow Up Plan:  Additional outreach attempts will be made to offer the patient complex care management information and services.   Encounter Outcome:  No Answer  Lenton Rail , RMA     [place]  [facility], [facility] Guide  Direct Dial: [phone]  Website: [email or web address]

## 2023-07-18 NOTE — Progress Notes (Signed)
 Complex Care Management Note Care Guide Note  07/18/2023 Name: Amanda Brown MRN: [id number] DOB: April 15, 1995   Complex Care Management Outreach Attempts: A third unsuccessful outreach was attempted today to offer the patient with information about available complex care management services.  Follow Up Plan:  No further outreach attempts will be made at this time. We have been unable to contact the patient to offer or enroll patient in complex care management services.  Encounter Outcome:  No Answer  Lenton Rail , RMA     [place]  [facility], [facility] Guide  Direct Dial: [phone]  Website: [email or web address]

## 2023-07-27 ENCOUNTER — Encounter: Payer: Self-pay | Admitting: Nurse Practitioner

## 2023-07-30 ENCOUNTER — Encounter: Payer: Self-pay | Admitting: Nurse Practitioner

## 2023-08-04 NOTE — Patient Instructions (Incomplete)
 Be Involved in Caring For Your Health:  Taking Medications When medications are taken as directed, they can greatly improve your health. But if they are not taken as prescribed, they may not work. In some cases, not taking them correctly can be harmful. To help ensure your treatment remains effective and safe, understand your medications and how to take them. Bring your medications to each visit for review by your provider.  Your lab results, notes, and after visit summary will be available on My Chart. We strongly encourage you to use this feature. If lab results are abnormal the clinic will contact you with the appropriate steps. If the clinic does not contact you assume the results are satisfactory. You can always view your results on My Chart. If you have questions regarding your health or results, please contact the clinic during office hours. You can also ask questions on My Chart.  We at [facility] are grateful that you chose [place] to provide your care. We strive to provide evidence-based and compassionate care and are always looking for feedback. If you get a survey from the clinic please complete this so we can hear your opinions.  DASH Eating Plan DASH stands for Dietary Approaches to Stop Hypertension. The DASH eating plan is a healthy eating plan that has been shown to: Lower high blood pressure (hypertension). Reduce your risk for type 2 diabetes, heart disease, and stroke. Help with weight loss. What are tips for following this plan? Reading food labels Check food labels for the amount of salt (sodium) per serving. Choose foods with less than 5 percent of the Daily Value (DV) of sodium. In general, foods with less than 300 milligrams (mg) of sodium per serving fit into this eating plan. To find whole grains, look for the word "whole" as the first word in the ingredient list. Shopping Buy products labeled as "low-sodium" or "no salt added." Buy fresh foods. Avoid canned  foods and pre-made or frozen meals. Cooking Try not to add salt when you cook. Use salt-free seasonings or herbs instead of table salt or sea salt. Check with your health care provider or pharmacist before using salt substitutes. Do not fry foods. Cook foods in healthy ways, such as baking, boiling, grilling, roasting, or broiling. Cook using oils that are good for your heart. These include olive, canola, avocado, soybean, and sunflower oil. Meal planning  Eat a balanced diet. This should include: 4 or more servings of fruits and 4 or more servings of vegetables each day. Try to fill half of your plate with fruits and vegetables. 6-8 servings of whole grains each day. 6 or less servings of lean meat, poultry, or fish each day. 1 oz is 1 serving. A 3 oz (85 g) serving of meat is about the same size as the palm of your hand. One egg is 1 oz (28 g). 2-3 servings of low-fat dairy each day. One serving is 1 cup (237 mL). 1 serving of nuts, seeds, or beans 5 times each week. 2-3 servings of heart-healthy fats. Healthy fats called omega-3 fatty acids are found in foods such as walnuts, flaxseeds, fortified milks, and eggs. These fats are also found in cold-water fish, such as sardines, salmon, and mackerel. Limit how much you eat of: Canned or prepackaged foods. Food that is high in trans fat, such as fried foods. Food that is high in saturated fat, such as fatty meat. Desserts and other sweets, sugary drinks, and other foods with added sugar. Full-fat  dairy products. Do not salt foods before eating. Do not eat more than 4 egg yolks a week. Try to eat at least 2 vegetarian meals a week. Eat more home-cooked food and less restaurant, buffet, and fast food. Lifestyle When eating at a restaurant, ask if your food can be made with less salt or no salt. If you drink alcohol: Limit how much you have to: 0-1 drink a day if you are female. 0-2 drinks a day if you are female. Know how much alcohol is in  your drink. In the [place] one drink is one 12 oz bottle of beer (355 mL), one 5 oz glass of wine (148 mL), or one 1 oz glass of hard liquor (44 mL). General information Avoid eating more than 2,300 mg of salt a day. If you have hypertension, you may need to reduce your sodium intake to 1,500 mg a day. Work with your provider to stay at a healthy body weight or lose weight. Ask what the best weight range is for you. On most days of the week, get at least 30 minutes of exercise that causes your heart to beat faster. This may include walking, swimming, or biking. Work with your provider or dietitian to adjust your eating plan to meet your specific calorie needs. What foods should I eat? Fruits All fresh, dried, or frozen fruit. Canned fruits that are in their natural juice and do not have sugar added to them. Vegetables Fresh or frozen vegetables that are raw, steamed, roasted, or grilled. Low-sodium or reduced-sodium tomato and vegetable juice. Low-sodium or reduced-sodium tomato sauce and tomato paste. Low-sodium or reduced-sodium canned vegetables. Grains Whole-grain or whole-wheat bread. Whole-grain or whole-wheat pasta. Brown rice. Orpah Cobb. Bulgur. Whole-grain and low-sodium cereals. Pita bread. Low-fat, low-sodium crackers. Whole-wheat flour tortillas. Meats and other proteins Skinless chicken or [place]. Ground chicken or [place]. Pork with fat trimmed off. Fish and seafood. Egg whites. Dried beans, peas, or lentils. Unsalted nuts, nut butters, and seeds. Unsalted canned beans. Lean cuts of beef with fat trimmed off. Low-sodium, lean precooked or cured meat, such as sausages or meat loaves. Dairy Low-fat (1%) or fat-free (skim) milk. Reduced-fat, low-fat, or fat-free cheeses. Nonfat, low-sodium ricotta or cottage cheese. Low-fat or nonfat yogurt. Low-fat, low-sodium cheese. Fats and oils Soft margarine without trans fats. Vegetable oil. Reduced-fat, low-fat, or light mayonnaise and salad  dressings (reduced-sodium). Canola, safflower, olive, avocado, soybean, and sunflower oils. Avocado. Seasonings and condiments Herbs. Spices. Seasoning mixes without salt. Other foods Unsalted popcorn and pretzels. Fat-free sweets. The items listed above may not be all the foods and drinks you can have. Talk to a dietitian to learn more. What foods should I avoid? Fruits Canned fruit in a light or heavy syrup. Fried fruit. Fruit in cream or butter sauce. Vegetables Creamed or fried vegetables. Vegetables in a cheese sauce. Regular canned vegetables that are not marked as low-sodium or reduced-sodium. Regular canned tomato sauce and paste that are not marked as low-sodium or reduced-sodium. Regular tomato and vegetable juices that are not marked as low-sodium or reduced-sodium. Rosita Fire. Olives. Grains Baked goods made with fat, such as croissants, muffins, or some breads. Dry pasta or rice meal packs. Meats and other proteins Fatty cuts of meat. Ribs. Fried meat. Tomasa Blase. Bologna, salami, and other precooked or cured meats, such as sausages or meat loaves, that are not lean and low in sodium. Fat from the back of a pig (fatback). Bratwurst. Salted nuts and seeds. Canned beans with added salt. Canned  or smoked fish. Whole eggs or egg yolks. Chicken or [place] with skin. Dairy Whole or 2% milk, cream, and half-and-half. Whole or full-fat cream cheese. Whole-fat or sweetened yogurt. Full-fat cheese. Nondairy creamers. Whipped toppings. Processed cheese and cheese spreads. Fats and oils Butter. Stick margarine. Lard. Shortening. Ghee. Bacon fat. Tropical oils, such as coconut, palm kernel, or palm oil. Seasonings and condiments Onion salt, garlic salt, seasoned salt, table salt, and sea salt. Worcestershire sauce. Tartar sauce. Barbecue sauce. Teriyaki sauce. Soy sauce, including reduced-sodium soy sauce. Steak sauce. Canned and packaged gravies. Fish sauce. Oyster sauce. Cocktail sauce. Store-bought  horseradish. Ketchup. Mustard. Meat flavorings and tenderizers. Bouillon cubes. Hot sauces. Pre-made or packaged marinades. Pre-made or packaged taco seasonings. Relishes. Regular salad dressings. Other foods Salted popcorn and pretzels. The items listed above may not be all the foods and drinks you should avoid. Talk to a dietitian to learn more. Where to find more information National Heart, Lung, and Blood Institute (NHLBI): [employer]DryCleaner.gl American Heart Association (AHA): heart.org Academy of Nutrition and Dietetics: eatright.org National Kidney Foundation (NKF): kidney.org This information is not intended to replace advice given to you by your health care provider. Make sure you discuss any questions you have with your health care provider. Document Revised: 03/08/2022 Document Reviewed: 03/08/2022 Elsevier Patient Education  2024 [employer].

## 2023-08-09 ENCOUNTER — Ambulatory Visit: Admitting: Nurse Practitioner

## 2023-08-09 DIAGNOSIS — I10 Essential (primary) hypertension: Secondary | ICD-10-CM
